# Patient Record
Sex: Male | Born: 1965 | Hispanic: No | Marital: Married | Smoking: Former smoker
Health system: Southern US, Community
[De-identification: ages and names within clinical notes are randomized; demographics above are authoritative.]

## PROBLEM LIST (undated history)

## (undated) DIAGNOSIS — K219 Gastro-esophageal reflux disease without esophagitis: Secondary | ICD-10-CM

## (undated) DIAGNOSIS — S83249A Other tear of medial meniscus, current injury, unspecified knee, initial encounter: Secondary | ICD-10-CM

## (undated) HISTORY — PX: NO PAST SURGERIES: SHX2092

---

## 1998-09-25 ENCOUNTER — Emergency Department (HOSPITAL_COMMUNITY): Admission: EM | Admit: 1998-09-25 | Discharge: 1998-09-25 | Payer: Self-pay | Admitting: Emergency Medicine

## 2000-02-11 ENCOUNTER — Emergency Department (HOSPITAL_COMMUNITY): Admission: EM | Admit: 2000-02-11 | Discharge: 2000-02-11 | Payer: Self-pay | Admitting: Emergency Medicine

## 2000-02-12 ENCOUNTER — Encounter: Payer: Self-pay | Admitting: Emergency Medicine

## 2012-02-03 ENCOUNTER — Ambulatory Visit
Admission: RE | Admit: 2012-02-03 | Discharge: 2012-02-03 | Disposition: A | Discharge status: Home / Self Care | Payer: No Typology Code available for payment source | Source: Ambulatory Visit | Attending: Physician Assistant | Admitting: Physician Assistant

## 2012-02-03 ENCOUNTER — Other Ambulatory Visit: Payer: Self-pay | Admitting: Physician Assistant

## 2012-02-03 DIAGNOSIS — M79672 Pain in left foot: Secondary | ICD-10-CM

## 2018-05-06 ENCOUNTER — Other Ambulatory Visit: Payer: Self-pay

## 2018-05-06 ENCOUNTER — Encounter: Payer: Self-pay | Admitting: Emergency Medicine

## 2018-05-06 ENCOUNTER — Ambulatory Visit: Payer: BLUE CROSS/BLUE SHIELD | Admitting: Emergency Medicine

## 2018-05-06 VITALS — BP 127/76 | HR 68 | Temp 98.3°F | Resp 16 | Ht 66.0 in | Wt 160.2 lb

## 2018-05-06 DIAGNOSIS — Z1211 Encounter for screening for malignant neoplasm of colon: Secondary | ICD-10-CM | POA: Diagnosis not present

## 2018-05-06 DIAGNOSIS — Z0000 Encounter for general adult medical examination without abnormal findings: Secondary | ICD-10-CM | POA: Diagnosis not present

## 2018-05-06 DIAGNOSIS — Z23 Encounter for immunization: Secondary | ICD-10-CM

## 2018-05-06 DIAGNOSIS — Z114 Encounter for screening for human immunodeficiency virus [HIV]: Secondary | ICD-10-CM

## 2018-05-06 DIAGNOSIS — Z13228 Encounter for screening for other metabolic disorders: Secondary | ICD-10-CM

## 2018-05-06 DIAGNOSIS — Z130 Encounter for screening for diseases of the blood and blood-forming organs and certain disorders involving the immune mechanism: Secondary | ICD-10-CM | POA: Diagnosis not present

## 2018-05-06 DIAGNOSIS — Z13220 Encounter for screening for lipoid disorders: Secondary | ICD-10-CM

## 2018-05-06 DIAGNOSIS — Z1329 Encounter for screening for other suspected endocrine disorder: Secondary | ICD-10-CM

## 2018-05-06 NOTE — Progress Notes (Signed)
Vincent Wade 52 y.o.   Chief Complaint  Patient presents with  . Annual Exam    HISTORY OF PRESENT ILLNESS: This is a 52 y.o. male here for annual exam.  First time visit with me. Denies any chronic medical problems on no chronic medications. Non-smoker.  Non-EtOH abuser. Works as a Education administrator.  Adequate sleep.  Good nutrition. Never had a colonoscopy. Needs Tdap and flu vaccine. Today has no complaints or medical concerns.   HPI   Prior to Admission medications   Not on File    No Known Allergies  There are no active problems to display for this patient.   No past medical history on file.    Social History   Socioeconomic History  . Marital status: Married    Spouse name: Not on file  . Number of children: Not on file  . Years of education: Not on file  . Highest education level: Not on file  Occupational History  . Not on file  Social Needs  . Financial resource strain: Not on file  . Food insecurity:    Worry: Not on file    Inability: Not on file  . Transportation needs:    Medical: Not on file    Non-medical: Not on file  Tobacco Use  . Smoking status: Never Smoker  . Smokeless tobacco: Never Used  Substance and Sexual Activity  . Alcohol use: Never    Frequency: Never  . Drug use: Never  . Sexual activity: Not on file  Lifestyle  . Physical activity:    Days per week: Not on file    Minutes per session: Not on file  . Stress: Not on file  Relationships  . Social connections:    Talks on phone: Not on file    Gets together: Not on file    Attends religious service: Not on file    Active member of club or organization: Not on file    Attends meetings of clubs or organizations: Not on file    Relationship status: Not on file  . Intimate partner violence:    Fear of current or ex partner: Not on file    Emotionally abused: Not on file    Physically abused: Not on file    Forced sexual activity: Not on file  Other Topics Concern  . Not on  file  Social History Narrative  . Not on file    No family history on file.   Review of Systems  Constitutional: Negative.  Negative for chills, fever and weight loss.  HENT: Negative.  Negative for hearing loss, sinus pain and sore throat.   Eyes: Negative.  Negative for blurred vision, double vision, discharge and redness.  Respiratory: Negative.  Negative for cough and shortness of breath.   Cardiovascular: Negative.  Negative for chest pain and palpitations.  Gastrointestinal: Negative.  Negative for abdominal pain, blood in stool, diarrhea, melena, nausea and vomiting.  Genitourinary: Negative.  Negative for dysuria, flank pain and hematuria.  Musculoskeletal: Positive for back pain (Occasional right-sided lumbar pain).  Skin: Negative.  Negative for rash.  Neurological: Negative.  Negative for dizziness and headaches.  Endo/Heme/Allergies: Negative.   All other systems reviewed and are negative.   Vitals:   05/06/18 0811  BP: 127/76  Pulse: 68  Resp: 16  Temp: 98.3 F (36.8 C)  SpO2: 97%    Physical Exam  Constitutional: He is oriented to person, place, and time. He appears well-developed and well-nourished.  HENT:  Head: Normocephalic and atraumatic.  Right Ear: External ear normal.  Left Ear: External ear normal.  Nose: Nose normal.  Mouth/Throat: Oropharynx is clear and moist.  Eyes: Pupils are equal, round, and reactive to light. Conjunctivae and EOM are normal.  Neck: Normal range of motion. Neck supple. Carotid bruit is not present. No thyromegaly present.  Cardiovascular: Normal rate, regular rhythm, normal heart sounds and intact distal pulses.  Pulmonary/Chest: Effort normal and breath sounds normal.  Abdominal: Soft. Bowel sounds are normal. He exhibits no distension. There is no tenderness.  Musculoskeletal: Normal range of motion.  Lymphadenopathy:    He has no cervical adenopathy.  Neurological: He is alert and oriented to person, place, and time.  No sensory deficit. He exhibits normal muscle tone.  Skin: Skin is warm and dry. Capillary refill takes less than 2 seconds.  Psychiatric: He has a normal mood and affect. His behavior is normal.  Vitals reviewed.    ASSESSMENT & PLAN: Vincent Wade was seen today for annual exam.  Diagnoses and all orders for this visit:  Screening for HIV (human immunodeficiency virus) -     HIV Antibody (routine testing w rflx)  Routine general medical examination at a health care facility -     Comprehensive metabolic panel -     CBC with Differential/Platelet  Colon cancer screening -     Ambulatory referral to Gastroenterology  Screening for deficiency anemia -     CBC with Differential/Platelet  Screening for lipoid disorders -     Lipid panel  Screening for endocrine, metabolic and immunity disorder -     Comprehensive metabolic panel -     CBC with Differential/Platelet -     Lipid panel -     TSH -     Hemoglobin A1c  Vaccine for diphtheria-tetanus-pertussis, combined -     Tdap vaccine greater than or equal to 7yo IM  Need for immunization against influenza  Flu vaccine need -     Flu Vaccine QUAD 36+ mos IM    Patient Instructions       If you have lab work done today you will be contacted with your lab results within the next 2 weeks.  If you have not heard from [place] then please contact us. The fastest way to get your results is to register for My Chart.   IF you received an x-ray today, you will receive an invoice from [facility]. Please contact [facility] at [phone] with questions or concerns regarding your invoice.   IF you received labwork today, you will receive an invoice from [facility]. Please contact [facility] at [phone] with questions or concerns regarding your invoice.   Our billing staff will not be able to assist you with questions regarding bills from these companies.  You will be contacted with the lab results as soon as they are  available. The fastest way to get your results is to activate your My Chart account. Instructions are located on the last page of this paperwork. If you have not heard from [place] regarding the results in 2 weeks, please contact this office.      Mantenimiento de Engineer, civil (consulting) hombres (Health Maintenance, Male) Un estilo de vida saludable y los cuidados preventivos son importantes para la salud y Counsellor. Pregntele al mdico cul es el cronograma de exmenes peridicos adecuado para usted. QU DEBO SABER SOBRE EL PESO Y LA DIETA? Consuma una dieta saludable  Coma muchas verduras, frutas, cereales integrales,  productos lcteos con bajo contenido de [place]grasa y protenas magras.  No consuma muchos alimentos de alto contenido de grasas slidas, azcares agregados o sal. Mantenga un peso saludable La actividad fsica habitual puede ayudarlo a Baristaalcanzar o mantener un peso saludable. Deber hacer lo siguiente:  Realizar al menos 150minutos de actividad fsica por semana. El ejercicio debe aumentar la frecuencia cardaca y Development worker, international aidprovocar la transpiracin (ejercicio de Kanarravilleintensidad moderada).  Hacer ejercicios de entrenamiento de fuerza por lo Rite Aidmenos dos veces por semana. Controlarse los niveles de colesterol y lpidos en la sangre  Hgase anlisis de sangre para controlar los lpidos y el colesterol cada 5aos a partir de los 35aos. Si tiene un riesgo alto de Warehouse managertener cardiopatas coronarias, debe comenzar a Assuranthacerse anlisis de [place]sangre a los [place]20aos. Es posible que Insurance underwriternecesite controlar los niveles de colesterol con mayor frecuencia si: ? Sus niveles de lpidos y colesterol son altos. ? Es mayor de [id number]. ? Tiene un riesgo alto de tener cardiopatas coronarias. QU DEBO SABER SOBRE LAS PRUEBAS DE DETECCIN DEL CNCER? Muchos tipos de cncer se pueden detectar de manera temprana y a menudo prevenirse. Cncer de pulmn  Debe someterse a pruebas de deteccin de cncer de pulmn todos los aos en los  siguientes casos: ? Si fuma actualmente y lo ha hecho durante por lo menos 30aos. ? Si fue fumador que dej el hbito en el trmino de los ltimos 15aos.  Hable con el mdico sobre las opciones en relacin con los estudios de deteccin, cundo debe comenzar a Actuaryhacrselos y con Engineer, structuralqu frecuencia. Cncer colorrectal  Generalmente, las pruebas de deteccin habituales del cncer colorrectal comienzan a los 50aos y deben repetirse cada 5 a 10aos hasta los 75aos. Es posible que tenga que hacerse las pruebas con mayor frecuencia si se detectan formas tempranas de plipos precancerosos o pequeos bultos. Sin embargo, el mdico podr aconsejarle que lo haga antes, si tiene factores de riesgo para el cncer de colon.  El mdico puede recomendarle que use kits de prueba caseros para Recruitment consultanthallar sangre oculta en la materia fecal.  Se puede usar una pequea cmara en el extremo de un tubo para examinar el colon (sigmoidoscopia o colonoscopia). Este estudio PPG Industriesdetecta las formas ms tempranas de Building services engineercncer colorrectal. Cncer de prstata y de testculo  En funcin de la edad y del [place] Williamestado de salud general, el mdico puede realizarle determinados estudios de deteccin del cncer de prstata y de testculo.  Hable con el mdico sobre cualquier sntoma o acerca de las inquietudes que tenga sobre el cncer de prstata o de testculo. Cncer de piel  Revise la piel de la cabeza a los pies con regularidad.  Informe al mdico si aparecen nuevos lunares o si nota cambios en los que ya tiene, especialmente en estos casos: ? Si hay un cambio en el tamao, la forma o el color del lunar. ? Si tiene un lunar que es ms grande que el tamao de una goma de Paramediclpiz.  Siempre use pantalla solar. Aplquese pantalla solar de Barth Kirksmanera generosa y repetida a lo largo del Futures traderda.  Use mangas y Automatic Datapantalones largos, un sombrero de ala ancha y gafas para el sol cuando est al Guadalupe Dawnaire libre, para protegerse. QU DEBO SABER SOBRE LAS CARDIOPATAS  CORONARIAS, LA DIABETES Y LA HIPERTENSIN ARTERIAL?  Si usted tiene entre 18 y 39aos, debe medirse la presin arterial cada 3a 5aos. Si usted tiene 40aos o ms, debe medirse la presin arterial Allied Waste Industriestodos los aos. Debe medirse la presin arterial dos veces:  una vez cuando est en un hospital o una clnica y la otra vez cuando est en otro sitio. Registre el promedio de [employer]. Para controlar su presin arterial cuando no est en un hospital o Paulita Cradle, puede usar lo siguiente: ? Valere Dross automtica para medir la presin arterial en una farmacia. ? Un monitor para medir la presin arterial en el hogar.  Hable con el mdico [employer] ideales de la presin arterial.  Si tiene entre 45 y 79aos, consltele al mdico si debe tomar aspirina para evitar las cardiopatas coronarias.  Hgase anlisis habituales de deteccin de la diabetes; para ello, contrlese la glucemia en ayunas. ? Si su peso es normal y tiene un bajo riesgo de padecer diabetes, realcese este anlisis cada tres aos despus de los 45aos. ? Si tiene sobrepeso y un alto riesgo de padecer diabetes, considere someterse a este anlisis antes o con mayor frecuencia.  Para los hombres que tienen entre 65 y 84aos, y son o han sido fumadores, se recomienda un nico estudio con ecografa para Engineer, manufacturing un aneurisma de aorta abdominal (AAA). QU DEBO SABER SOBRE LA PREVENCIN DE LAS INFECCIONES? HepatitisB Si tiene un riesgo ms alto de Primary school teacher hepatitis B, debe someterse a un examen de deteccin de este virus. Hable con el mdico para determinar si corre riesgo de tener una infeccin por hepatitisB. Hepatitis C Se recomienda un anlisis de [place] para:  Todos los que nacieron entre 1945 y [phone].  Todas las personas que tengan un riesgo de haber contrado hepatitis C. Enfermedades de transmisin sexual (ETS)  Debe realizarse pruebas de Airline pilot de las ETS todos los aos, incluidas la gonorrea y la  clamidia, en estos casos: ? Es sexualmente activo y es menor de [place]. ? Es mayor de 24aos, y Public affairs consultant informa que corre riesgo de tener este tipo de infecciones. ? La actividad sexual ha cambiado desde que le hicieron la ltima prueba de deteccin y tiene un riesgo mayor de Warehouse manager clamidia o Copy. Pregntele al mdico si usted tiene riesgo.  Consulte a su mdico para saber si tiene un alto riesgo de infectarse por el VIH. El mdico puede recomendarle un medicamento de venta con receta para ayudar a evitar la infeccin por el VIH. QU MS PUEDO HACER?  Realcese los estudios de rutina de la salud, dentales y de Wellsite geologist.  Mantngase al da con las vacunas (inmunizaciones).  No consuma ningn producto que contenga tabaco, lo que incluye cigarrillos, tabaco de Theatre manager y Administrator, Civil Service. Si necesita ayuda para dejar de fumar, consulte al mdico.  Limite el consumo de alcohol a no ms de por da. [employer] a 12 onzas de cerveza, 5onzas de vino o 1onzas de bebidas alcohlicas de alta graduacin.  No consuma drogas.  No comparta agujas.  Solicite ayuda a su mdico si necesita apoyo o informacin para abandonar las drogas.  Informe a su mdico si a menudo se siente deprimido.  Notifique a su mdico si alguna vez ha sido vctima de abuso o si no se siente seguro en su hogar. Esta informacin no tiene Theme park manager el consejo del mdico. Asegrese de hacerle al mdico cualquier pregunta que tenga. Document Released: 11/14/2007 Document Revised: 06/08/2014 Document Reviewed: 02/19/2015 Elsevier Interactive Patient Education  2018 Elsevier Inc.      Edwina Barth, MD [facility]

## 2018-05-06 NOTE — Patient Instructions (Addendum)
   If you have lab work done today you will be contacted with your lab results within the next 2 weeks.  If you have not heard from us then please contact us. The fastest way to get your results is to register for My Chart.   IF you received an x-ray today, you will receive an invoice from [facility]. Please contact [facility] at [phone] with questions or concerns regarding your invoice.   IF you received labwork today, you will receive an invoice from [facility]. Please contact [facility] at [phone] with questions or concerns regarding your invoice.   Our billing staff will not be able to assist you with questions regarding bills from these companies.  You will be contacted with the lab results as soon as they are available. The fastest way to get your results is to activate your My Chart account. Instructions are located on the last page of this paperwork. If you have not heard from us regarding the results in 2 weeks, please contact this office.     Mantenimiento de la salud en los hombres (Health Maintenance, Male) Un estilo de vida saludable y los cuidados preventivos son importantes para la salud y el bienestar. Pregntele al mdico cul es el cronograma de exmenes peridicos adecuado para usted. QU DEBO SABER SOBRE EL PESO Y LA DIETA? Consuma una dieta saludable  Coma muchas verduras, frutas, cereales integrales, productos lcteos con bajo contenido de grasa y protenas magras.  No consuma muchos alimentos de alto contenido de grasas slidas, azcares agregados o sal. Mantenga un peso saludable La actividad fsica habitual puede ayudarlo a alcanzar o mantener un peso saludable. Deber hacer lo siguiente:  Realizar al menos 150minutos de actividad fsica por semana. El ejercicio debe aumentar la frecuencia cardaca y provocar la transpiracin (ejercicio de intensidad moderada).  Hacer ejercicios de entrenamiento de fuerza por lo menos dos veces por  semana. Controlarse los niveles de colesterol y lpidos en la sangre  Hgase anlisis de sangre para controlar los lpidos y el colesterol cada 5aos a partir de los 35aos. Si tiene un riesgo alto de tener cardiopatas coronarias, debe comenzar a hacerse anlisis de sangre a los 20aos. Es posible que necesite controlar los niveles de colesterol con mayor frecuencia si: ? Sus niveles de lpidos y colesterol son altos. ? Es mayor de 50aos. ? Tiene un riesgo alto de tener cardiopatas coronarias. QU DEBO SABER SOBRE LAS PRUEBAS DE DETECCIN DEL CNCER? Muchos tipos de cncer se pueden detectar de manera temprana y a menudo prevenirse. Cncer de pulmn  Debe someterse a pruebas de deteccin de cncer de pulmn todos los aos en los siguientes casos: ? Si fuma actualmente y lo ha hecho durante por lo menos 30aos. ? Si fue fumador que dej el hbito en el trmino de los ltimos 15aos.  Hable con el mdico sobre las opciones en relacin con los estudios de deteccin, cundo debe comenzar a hacrselos y con qu frecuencia. Cncer colorrectal  Generalmente, las pruebas de deteccin habituales del cncer colorrectal comienzan a los 50aos y deben repetirse cada 5 a 10aos hasta los 75aos. Es posible que tenga que hacerse las pruebas con mayor frecuencia si se detectan formas tempranas de plipos precancerosos o pequeos bultos. Sin embargo, el mdico podr aconsejarle que lo haga antes, si tiene factores de riesgo para el cncer de colon.  El mdico puede recomendarle que use kits de prueba caseros para hallar sangre oculta en la materia fecal.  Se puede usar   una pequea cmara en el extremo de un tubo para examinar el colon (sigmoidoscopia o colonoscopia). Este estudio detecta las formas ms tempranas de cncer colorrectal. Cncer de prstata y de testculo  En funcin de la edad y del estado de salud general, el mdico puede realizarle determinados estudios de deteccin del cncer  de prstata y de testculo.  Hable con el mdico sobre cualquier sntoma o acerca de las inquietudes que tenga sobre el cncer de prstata o de testculo. Cncer de piel  Revise la piel de la cabeza a los pies con regularidad.  Informe al mdico si aparecen nuevos lunares o si nota cambios en los que ya tiene, especialmente en estos casos: ? Si hay un cambio en el tamao, la forma o el color del lunar. ? Si tiene un lunar que es ms grande que el tamao de una goma de lpiz.  Siempre use pantalla solar. Aplquese pantalla solar de manera generosa y repetida a lo largo del da.  Use mangas y pantalones largos, un sombrero de ala ancha y gafas para el sol cuando est al aire libre, para protegerse. QU DEBO SABER SOBRE LAS CARDIOPATAS CORONARIAS, LA DIABETES Y LA HIPERTENSIN ARTERIAL?  Si usted tiene entre 18 y 39aos, debe medirse la presin arterial cada 3a 5aos. Si usted tiene 40aos o ms, debe medirse la presin arterial todos los aos. Debe medirse la presin arterial dos veces: una vez cuando est en un hospital o una clnica y la otra vez cuando est en otro sitio. Registre el promedio de las dos mediciones. Para controlar su presin arterial cuando no est en un hospital o una clnica, puede usar lo siguiente: ? Una mquina automtica para medir la presin arterial en una farmacia. ? Un monitor para medir la presin arterial en el hogar.  Hable con el mdico sobre los valores ideales de la presin arterial.  Si tiene entre 45 y 79aos, consltele al mdico si debe tomar aspirina para evitar las cardiopatas coronarias.  Hgase anlisis habituales de deteccin de la diabetes; para ello, contrlese la glucemia en ayunas. ? Si su peso es normal y tiene un bajo riesgo de padecer diabetes, realcese este anlisis cada tres aos despus de los 45aos. ? Si tiene sobrepeso y un alto riesgo de padecer diabetes, considere someterse a este anlisis antes o con mayor  frecuencia.  Para los hombres que tienen entre 65 y 75aos, y son o han sido fumadores, se recomienda un nico estudio con ecografa para detectar un aneurisma de aorta abdominal (AAA). QU DEBO SABER SOBRE LA PREVENCIN DE LAS INFECCIONES? HepatitisB Si tiene un riesgo ms alto de contraer hepatitis B, debe someterse a un examen de deteccin de este virus. Hable con el mdico para determinar si corre riesgo de tener una infeccin por hepatitisB. Hepatitis C Se recomienda un anlisis de sangre para:  Todos los que nacieron entre 1945 y 1965.  Todas las personas que tengan un riesgo de haber contrado hepatitis C. Enfermedades de transmisin sexual (ETS)  Debe realizarse pruebas de deteccin de las ETS todos los aos, incluidas la gonorrea y la clamidia, en estos casos: ? Es sexualmente activo y es menor de 24aos. ? Es mayor de 24aos, y el mdico le informa que corre riesgo de tener este tipo de infecciones. ? La actividad sexual ha cambiado desde que le hicieron la ltima prueba de deteccin y tiene un riesgo mayor de tener clamidia o gonorrea. Pregntele al mdico si usted tiene riesgo.  Consulte a su   su mdico para saber si tiene un alto riesgo de infectarse por el VIH. El mdico puede recomendarle un medicamento de venta con receta para ayudar a evitar la infeccin por el VIH. QU MS PUEDO HACER?  Realcese los estudios de rutina de la salud, dentales y de Wellsite geologistla vista.  Mantngase al da con las vacunas (inmunizaciones).  No consuma ningn producto que contenga tabaco, lo que incluye cigarrillos, tabaco de Theatre managermascar y Administrator, Civil Servicecigarrillos electrnicos. Si necesita ayuda para dejar de fumar, consulte al mdico.  Limite el consumo de alcohol a no ms de 2medidas por da. BorgWarnerUna medida equivale a 12 onzas de cerveza, 5onzas de vino o 1onzas de bebidas alcohlicas de alta graduacin.  No consuma drogas.  No comparta agujas.  Solicite ayuda a su mdico si necesita apoyo o informacin para  abandonar las drogas.  Informe a su mdico si a menudo se siente deprimido.  Notifique a su mdico si alguna vez ha sido vctima de abuso o si no se siente seguro en su hogar. Esta informacin no tiene Theme park managercomo fin reemplazar el consejo del mdico. Asegrese de hacerle al mdico cualquier pregunta que tenga. Document Released: 11/14/2007 Document Revised: 06/08/2014 Document Reviewed: 02/19/2015 Elsevier Interactive Patient Education  [employer]2018 Elsevier Inc.

## 2018-05-07 LAB — COMPREHENSIVE METABOLIC PANEL
ALT: 20 IU/L (ref 0–44)
AST: 18 IU/L (ref 0–40)
Albumin/Globulin Ratio: 1.7 (ref 1.2–2.2)
Albumin: 4.7 g/dL (ref 3.5–5.5)
Alkaline Phosphatase: 74 IU/L (ref 39–117)
BUN/Creatinine Ratio: 13 (ref 9–20)
BUN: 12 mg/dL (ref 6–24)
Bilirubin Total: 0.6 mg/dL (ref 0.0–1.2)
CO2: 25 mmol/L (ref 20–29)
Calcium: 10 mg/dL (ref 8.7–10.2)
Chloride: 100 mmol/L (ref 96–106)
Creatinine, Ser: 0.96 mg/dL (ref 0.76–1.27)
GFR calc Af Amer: 104 mL/min/{1.73_m2} (ref >59)
GFR calc non Af Amer: 90 mL/min/{1.73_m2} (ref >59)
Globulin, Total: 2.7 g/dL (ref 1.5–4.5)
Glucose: 104 mg/dL — ABNORMAL HIGH (ref 65–99)
Potassium: 4.6 mmol/L (ref 3.5–5.2)
Sodium: 139 mmol/L (ref 134–144)
Total Protein: 7.4 g/dL (ref 6.0–8.5)

## 2018-05-07 LAB — HIV ANTIBODY (ROUTINE TESTING W REFLEX): HIV Screen 4th Generation wRfx: NONREACTIVE

## 2018-05-07 LAB — HEMOGLOBIN A1C
Est. average glucose Bld gHb Est-mCnc: 114 mg/dL
Hgb A1c MFr Bld: 5.6 % (ref 4.8–5.6)

## 2018-05-07 LAB — CBC WITH DIFFERENTIAL/PLATELET
Basophils Absolute: 0.1 10*3/uL (ref 0.0–0.2)
Basos: 1 %
EOS (ABSOLUTE): 0.1 10*3/uL (ref 0.0–0.4)
Eos: 2 %
Hematocrit: 49.7 % (ref 37.5–51.0)
Hemoglobin: 16.8 g/dL (ref 13.0–17.7)
Immature Grans (Abs): 0 10*3/uL (ref 0.0–0.1)
Immature Granulocytes: 0 %
Lymphocytes Absolute: 1.8 10*3/uL (ref 0.7–3.1)
Lymphs: 33 %
MCH: 28.8 pg (ref 26.6–33.0)
MCHC: 33.8 g/dL (ref 31.5–35.7)
MCV: 85 fL (ref 79–97)
Monocytes Absolute: 0.4 10*3/uL (ref 0.1–0.9)
Monocytes: 8 %
Neutrophils Absolute: 3.2 10*3/uL (ref 1.4–7.0)
Neutrophils: 56 %
Platelets: 236 10*3/uL (ref 150–450)
RBC: 5.83 x10E6/uL — ABNORMAL HIGH (ref 4.14–5.80)
RDW: 12.9 % (ref 12.3–15.4)
WBC: 5.6 10*3/uL (ref 3.4–10.8)

## 2018-05-07 LAB — LIPID PANEL
Chol/HDL Ratio: 3.7 ratio (ref 0.0–5.0)
Cholesterol, Total: 214 mg/dL — ABNORMAL HIGH (ref 100–199)
HDL: 58 mg/dL (ref >39)
LDL Calculated: 132 mg/dL — ABNORMAL HIGH (ref 0–99)
Triglycerides: 121 mg/dL (ref 0–149)
VLDL Cholesterol Cal: 24 mg/dL (ref 5–40)

## 2018-05-07 LAB — TSH: TSH: 2.2 u[IU]/mL (ref 0.450–4.500)

## 2018-05-10 ENCOUNTER — Encounter: Payer: Self-pay | Admitting: *Deleted

## 2018-05-11 ENCOUNTER — Encounter: Payer: Self-pay | Admitting: Gastroenterology

## 2018-07-01 ENCOUNTER — Encounter: Payer: Self-pay | Admitting: Gastroenterology

## 2019-04-03 ENCOUNTER — Other Ambulatory Visit: Payer: Self-pay

## 2019-04-03 ENCOUNTER — Other Ambulatory Visit: Payer: Self-pay | Admitting: *Deleted

## 2019-04-03 DIAGNOSIS — Z125 Encounter for screening for malignant neoplasm of prostate: Secondary | ICD-10-CM

## 2019-04-03 NOTE — Progress Notes (Signed)
Patient: Vincent Wade           Date of Birth: 1965/09/04           MRN: [id number] Visit Date: 04/03/2019 PCP: Horald Pollen, MD Temp: 96.9 Temporal    Prostate Exam Exam not completed.  Patient's History There are no active problems to display for this patient.  No past medical history on file.  No family history on file.  Social History   Occupational History  . Not on file  Tobacco Use  . Smoking status: Never Smoker  . Smokeless tobacco: Never Used  Substance and Sexual Activity  . Alcohol use: Never    Frequency: Never  . Drug use: Never  . Sexual activity: Not on file

## 2019-04-04 LAB — PSA: Prostate Specific Ag, Serum: 1.1 ng/mL (ref 0.0–4.0)

## 2020-01-26 ENCOUNTER — Encounter (HOSPITAL_COMMUNITY): Payer: Self-pay | Admitting: Emergency Medicine

## 2020-01-26 ENCOUNTER — Emergency Department (HOSPITAL_COMMUNITY)
Admission: EM | Admit: 2020-01-26 | Discharge: 2020-01-27 | Disposition: A | Discharge status: Home / Self Care | Payer: Self-pay | Attending: Emergency Medicine | Admitting: Emergency Medicine

## 2020-01-26 DIAGNOSIS — R2689 Other abnormalities of gait and mobility: Secondary | ICD-10-CM | POA: Insufficient documentation

## 2020-01-26 DIAGNOSIS — M79662 Pain in left lower leg: Secondary | ICD-10-CM | POA: Insufficient documentation

## 2020-01-26 DIAGNOSIS — M79604 Pain in right leg: Secondary | ICD-10-CM

## 2020-01-26 DIAGNOSIS — M7918 Myalgia, other site: Secondary | ICD-10-CM | POA: Insufficient documentation

## 2020-01-26 NOTE — ED Triage Notes (Signed)
Pt c/o RLE pain x 2 weeks. Pt has not been seen for pain, pt sent here from MD office after noticing the leg slightly more swollen than the the left for doppler

## 2020-01-27 ENCOUNTER — Emergency Department (HOSPITAL_BASED_OUTPATIENT_CLINIC_OR_DEPARTMENT_OTHER): Payer: Self-pay

## 2020-01-27 ENCOUNTER — Other Ambulatory Visit: Payer: Self-pay

## 2020-01-27 DIAGNOSIS — M79609 Pain in unspecified limb: Secondary | ICD-10-CM

## 2020-01-27 DIAGNOSIS — M7989 Other specified soft tissue disorders: Secondary | ICD-10-CM

## 2020-01-27 MED ORDER — IBUPROFEN 800 MG PO TABS
800.0000 mg | ORAL_TABLET | Freq: Once | ORAL | Status: DC
  Filled 2020-01-27: qty 1

## 2020-01-27 MED ORDER — METHYLPREDNISOLONE 4 MG PO TBPK: ORAL_TABLET | ORAL | 0 refills | Status: DC

## 2020-01-27 MED ORDER — CELECOXIB 200 MG PO CAPS: 200.0000 mg | ORAL_CAPSULE | Freq: Two times a day (BID) | ORAL | 0 refills | Status: DC

## 2020-01-27 NOTE — Progress Notes (Signed)
VASCULAR LAB    Right lower extremity venous duplex completed.    Preliminary report:  See CV proc for preliminary results.  Messaged results to Arthor Captain, PA-C  Cinthia Rodden, RVT 01/27/2020, 12:15 PM

## 2020-01-27 NOTE — ED Provider Notes (Signed)
[facility] EMERGENCY DEPARTMENT Provider Note   CSN: [id number] Arrival date & time: 01/26/20  1958     History Chief Complaint  Patient presents with  . Leg Pain    Vincent Wade is a 54 y.o. male who presents for evaluation of RLE pain. History is limited by language barrier. Patient's family member translates. Pain began 2 weeks ago. In the R calf. He is painfree at rest. Pain is worse with ambulation and flexion of the R knee. No known injuries. No weakness, numbness or tingling. He was seen at PCP last night and sent in for [place] to rule out DVT. He denies cp or sob  HPI     History reviewed. No pertinent past medical history.  There are no problems to display for this patient.   History reviewed. No pertinent surgical history.     No family history on file.  Social History   Tobacco Use  . Smoking status: Never Smoker  . Smokeless tobacco: Never Used  Substance Use Topics  . Alcohol use: Never  . Drug use: Never    Home Medications Prior to Admission medications   Not on File    Allergies    Patient has no known allergies.  Review of Systems   Review of Systems  Constitutional: Negative for chills and fever.  Musculoskeletal: Positive for gait problem and myalgias. Negative for arthralgias and joint swelling.  Skin: Negative for color change and wound.  Neurological: Negative for weakness and numbness.    Physical Exam Updated Vital Signs BP 112/66 (BP Location: Left Arm)   Pulse 60   Temp 98.2 F (36.8 C) (Oral)   Resp 12   SpO2 100%   Physical Exam Vitals and nursing note reviewed.  Constitutional:      General: He is not in acute distress.    Appearance: He is well-developed. He is not diaphoretic.  HENT:     Head: Normocephalic and atraumatic.  Eyes:     General: No scleral icterus.    Conjunctiva/sclera: Conjunctivae normal.  Cardiovascular:     Rate and Rhythm: Normal rate and regular rhythm.     Heart sounds:  Normal heart sounds.  Pulmonary:     Effort: Pulmonary effort is normal. No respiratory distress.     Breath sounds: Normal breath sounds.  Abdominal:     Palpations: Abdomen is soft.     Tenderness: There is no abdominal tenderness.  Musculoskeletal:        General: No tenderness.     Cervical back: Normal range of motion and neck supple.     Right lower leg: No edema.     Left lower leg: No edema.     Comments: Questionable swelling R>L No tenderness to palpation Normal R knee and ankle exam No achilles or popliteal tenderness.  DP/PT 2+ Normal sensation Negative straight leg test  Skin:    General: Skin is warm and dry.  Neurological:     Mental Status: He is alert.  Psychiatric:        Behavior: Behavior normal.     ED Results / Procedures / Treatments   Labs (all labs ordered are listed, but only abnormal results are displayed) Labs Reviewed - No data to display  EKG None  Radiology No results found.  Procedures Procedures (including critical care time)  Medications Ordered in ED Medications - No data to display  ED Course  I have reviewed the triage vital signs and the nursing  notes.  Pertinent labs & imaging results that were available during my care of the patient were reviewed by me and considered in my medical decision making (see chart for details).    MDM Rules/Calculators/A&P                           Patient here with Left calf pain when ambulating. DVT study is negative. No evidence of compartment syndrome. The compartments are soft.  He potentially could have sciatica as the cause of the pain down the back of his calf and will treat with anti-inflammatory and Medrol Dosepak.  Patient will be discharged to follow-up with his primary care physician.  I doubt any other emergent cause of his leg pain.  Final Clinical Impression(s) / ED Diagnoses Final diagnoses:  None    Rx / DC Orders ED Discharge Orders    None       Arthor Captain,  PA-C 01/27/20 1218    Tilden Fossa, MD 01/27/20 1434

## 2020-01-27 NOTE — Discharge Instructions (Addendum)
Please follow up as soon as possible with your primary care doctor.  Contact a health care provider if: Your symptoms do not improve with rest and treatment. Get help right away if: You have swelling or redness in your calf that is getting worse. Your skin or toenails turn blue or gray, feel cold, or become numb.

## 2020-07-09 ENCOUNTER — Other Ambulatory Visit: Payer: Self-pay

## 2020-07-09 ENCOUNTER — Encounter: Payer: Self-pay | Admitting: Orthopaedic Surgery

## 2020-07-09 ENCOUNTER — Ambulatory Visit (INDEPENDENT_AMBULATORY_CARE_PROVIDER_SITE_OTHER): Payer: 59 | Admitting: Orthopaedic Surgery

## 2020-07-09 DIAGNOSIS — S83241A Other tear of medial meniscus, current injury, right knee, initial encounter: Secondary | ICD-10-CM | POA: Diagnosis not present

## 2020-07-09 NOTE — Progress Notes (Signed)
Office Visit Note   Patient: Vincent Wade           Date of Birth: April 25, 1966           MRN: [id number] Visit Date: 07/09/2020              Requested by: Georgina Quint, MD [address] [place],  [place] [address] PCP: Georgina Quint, MD   Assessment & Plan: Visit Diagnoses:  1. Acute medial meniscus tear of right knee, initial encounter     Plan: Impression is acute right medial meniscal tear.  I reviewed the MRI images which do show complex tear of the posterior horn the medial meniscus.  Given these findings and lack of improvement from conservative treatment I have recommended arthroscopic partial medial meniscectomy in the near future.  Patient has elected to proceed with surgery after consideration of risk benefits rehab recovery.  Today's encounter was made more complex due to the language barrier.  Follow-Up Instructions: Return for 10-day postop visit.   Orders:  No orders of the defined types were placed in this encounter.  No orders of the defined types were placed in this encounter.     Procedures: No procedures performed   Clinical Data: No additional findings.   Subjective: Chief Complaint  Patient presents with  . Right Knee - Pain    Vincent Wade is a 55 year old Hispanic gentleman who comes in for acute right medial meniscal tear referral from Dr. Turner Daniels at [facility].  Patient's insurance is not accepted at [facility] orthopedics but is compatible with our office.  Briefly he is a very pleasant 55 year old gentleman who developed knee pain suddenly a few days after moving furniture back in September 2021.  He underwent conservative treatment with cortisone injections as well as pills at [facility].  He subsequently underwent an MRI due to continued pain and failure of conservative treatment and was found to have a complex tear of the posterior horn of the medial meniscus with vertical horizontal components.  He continues to have pain on  the medial and posterior medial portions of the right knee.  He has pain and mechanical catching as well.   Review of Systems  Constitutional: Negative.   All other systems reviewed and are negative.    Objective: Vital Signs: There were no vitals taken for this visit.  Physical Exam Vitals and nursing note reviewed.  Constitutional:      Appearance: He is well-developed and well-nourished.  HENT:     Head: Normocephalic and atraumatic.  Eyes:     Pupils: Pupils are equal, round, and reactive to light.  Pulmonary:     Effort: Pulmonary effort is normal.  Abdominal:     Palpations: Abdomen is soft.  Musculoskeletal:        General: Normal range of motion.     Cervical back: Neck supple.  Skin:    General: Skin is warm.  Neurological:     Mental Status: He is alert and oriented to person, place, and time.  Psychiatric:        Mood and Affect: Mood and affect normal.        Behavior: Behavior normal.        Thought Content: Thought content normal.        Judgment: Judgment normal.     Ortho Exam Right knee shows trace effusion with exquisite medial joint tenderness.  Pain on the medial side with increased flexion and pain with McMurray testing.  Collaterals and  cruciates are stable. Specialty Comments:  No specialty comments available.  Imaging: No results found.   PMFS History: Patient Active Problem List   Diagnosis Date Noted  . Acute medial meniscus tear of right knee 07/09/2020   History reviewed. No pertinent past medical history.  History reviewed. No pertinent family history.  History reviewed. No pertinent surgical history. Social History   Occupational History  . Not on file  Tobacco Use  . Smoking status: Never Smoker  . Smokeless tobacco: Never Used  Substance and Sexual Activity  . Alcohol use: Never  . Drug use: Never  . Sexual activity: Not on file

## 2020-07-15 ENCOUNTER — Other Ambulatory Visit: Payer: Self-pay

## 2020-07-16 ENCOUNTER — Ambulatory Visit: Payer: Self-pay | Admitting: Orthopaedic Surgery

## 2020-07-19 ENCOUNTER — Encounter (HOSPITAL_BASED_OUTPATIENT_CLINIC_OR_DEPARTMENT_OTHER): Payer: Self-pay | Admitting: Orthopaedic Surgery

## 2020-07-19 ENCOUNTER — Other Ambulatory Visit: Payer: Self-pay

## 2020-07-20 ENCOUNTER — Inpatient Hospital Stay (HOSPITAL_COMMUNITY): Admission: RE | Admit: 2020-07-20 | Payer: 59 | Source: Ambulatory Visit

## 2020-07-22 ENCOUNTER — Other Ambulatory Visit (HOSPITAL_COMMUNITY)
Admission: RE | Admit: 2020-07-22 | Discharge: 2020-07-22 | Disposition: A | Discharge status: Home / Self Care | Payer: 59 | Source: Ambulatory Visit | Attending: Orthopaedic Surgery | Admitting: Orthopaedic Surgery

## 2020-07-22 DIAGNOSIS — Z01812 Encounter for preprocedural laboratory examination: Secondary | ICD-10-CM | POA: Insufficient documentation

## 2020-07-22 DIAGNOSIS — Z20822 Contact with and (suspected) exposure to covid-19: Secondary | ICD-10-CM | POA: Diagnosis not present

## 2020-07-23 LAB — SARS CORONAVIRUS 2 (TAT 6-24 HRS): SARS Coronavirus 2: NEGATIVE

## 2020-07-24 ENCOUNTER — Encounter: Payer: Self-pay | Admitting: Orthopaedic Surgery

## 2020-07-24 ENCOUNTER — Ambulatory Visit (HOSPITAL_BASED_OUTPATIENT_CLINIC_OR_DEPARTMENT_OTHER): Payer: 59 | Admitting: Certified Registered"

## 2020-07-24 ENCOUNTER — Ambulatory Visit (HOSPITAL_BASED_OUTPATIENT_CLINIC_OR_DEPARTMENT_OTHER)
Admission: RE | Admit: 2020-07-24 | Discharge: 2020-07-24 | Disposition: A | Discharge status: Home / Self Care | Payer: 59 | Attending: Orthopaedic Surgery | Admitting: Orthopaedic Surgery

## 2020-07-24 ENCOUNTER — Encounter (HOSPITAL_BASED_OUTPATIENT_CLINIC_OR_DEPARTMENT_OTHER): Payer: Self-pay | Admitting: Orthopaedic Surgery

## 2020-07-24 ENCOUNTER — Other Ambulatory Visit: Payer: Self-pay

## 2020-07-24 ENCOUNTER — Encounter (HOSPITAL_BASED_OUTPATIENT_CLINIC_OR_DEPARTMENT_OTHER)
Admission: RE | Disposition: A | Discharge status: Home / Self Care | Payer: Self-pay | Source: Home / Self Care | Attending: Orthopaedic Surgery

## 2020-07-24 DIAGNOSIS — X58XXXA Exposure to other specified factors, initial encounter: Secondary | ICD-10-CM | POA: Diagnosis not present

## 2020-07-24 DIAGNOSIS — S83241A Other tear of medial meniscus, current injury, right knee, initial encounter: Secondary | ICD-10-CM | POA: Diagnosis not present

## 2020-07-24 DIAGNOSIS — Y939 Activity, unspecified: Secondary | ICD-10-CM | POA: Diagnosis not present

## 2020-07-24 DIAGNOSIS — M659 Synovitis and tenosynovitis, unspecified: Secondary | ICD-10-CM | POA: Insufficient documentation

## 2020-07-24 DIAGNOSIS — Z791 Long term (current) use of non-steroidal anti-inflammatories (NSAID): Secondary | ICD-10-CM | POA: Insufficient documentation

## 2020-07-24 HISTORY — DX: Other tear of medial meniscus, current injury, unspecified knee, initial encounter: S83.249A

## 2020-07-24 HISTORY — DX: Gastro-esophageal reflux disease without esophagitis: K21.9

## 2020-07-24 HISTORY — PX: KNEE ARTHROSCOPY WITH MENISCAL REPAIR: SHX5653

## 2020-07-24 SURGERY — ARTHROSCOPY, KNEE, WITH MENISCUS REPAIR
Anesthesia: General | Site: Knee | Laterality: Right

## 2020-07-24 MED ORDER — PROPOFOL 10 MG/ML IV BOLUS
INTRAVENOUS | Status: AC
  Filled 2020-07-24: qty 20

## 2020-07-24 MED ORDER — PROPOFOL 10 MG/ML IV BOLUS
INTRAVENOUS | Status: DC | PRN
  Administered 2020-07-24: 160 mg via INTRAVENOUS

## 2020-07-24 MED ORDER — BUPIVACAINE HCL (PF) 0.5 % IJ SOLN
INTRAMUSCULAR | Status: DC | PRN
  Administered 2020-07-24: 20 mL via INTRA_ARTICULAR

## 2020-07-24 MED ORDER — FENTANYL CITRATE (PF) 100 MCG/2ML IJ SOLN
INTRAMUSCULAR | Status: AC
  Filled 2020-07-24: qty 2

## 2020-07-24 MED ORDER — DEXAMETHASONE SODIUM PHOSPHATE 10 MG/ML IJ SOLN
INTRAMUSCULAR | Status: AC
  Filled 2020-07-24: qty 1

## 2020-07-24 MED ORDER — DEXAMETHASONE SODIUM PHOSPHATE 10 MG/ML IJ SOLN
INTRAMUSCULAR | Status: DC | PRN
  Administered 2020-07-24: 5 mg via INTRAVENOUS

## 2020-07-24 MED ORDER — CEFAZOLIN SODIUM-DEXTROSE 2-4 GM/100ML-% IV SOLN
2.0000 g | INTRAVENOUS | Status: AC
  Administered 2020-07-24: 2 g via INTRAVENOUS

## 2020-07-24 MED ORDER — KETOROLAC TROMETHAMINE 30 MG/ML IJ SOLN
INTRAMUSCULAR | Status: AC
  Filled 2020-07-24: qty 1

## 2020-07-24 MED ORDER — CEFAZOLIN SODIUM-DEXTROSE 2-4 GM/100ML-% IV SOLN
INTRAVENOUS | Status: AC
  Filled 2020-07-24: qty 100

## 2020-07-24 MED ORDER — KETOROLAC TROMETHAMINE 30 MG/ML IJ SOLN
INTRAMUSCULAR | Status: DC | PRN
  Administered 2020-07-24: 30 mg via INTRAVENOUS

## 2020-07-24 MED ORDER — LACTATED RINGERS IV SOLN: INTRAVENOUS | Status: DC

## 2020-07-24 MED ORDER — HYDROMORPHONE HCL 1 MG/ML IJ SOLN: 0.2500 mg | INTRAMUSCULAR | Status: DC | PRN

## 2020-07-24 MED ORDER — MEPERIDINE HCL 25 MG/ML IJ SOLN: 6.2500 mg | INTRAMUSCULAR | Status: DC | PRN

## 2020-07-24 MED ORDER — ONDANSETRON HCL 4 MG/2ML IJ SOLN
INTRAMUSCULAR | Status: AC
  Filled 2020-07-24: qty 2

## 2020-07-24 MED ORDER — ONDANSETRON HCL 4 MG/2ML IJ SOLN
INTRAMUSCULAR | Status: DC | PRN
  Administered 2020-07-24: 4 mg via INTRAVENOUS

## 2020-07-24 MED ORDER — OXYCODONE HCL 5 MG PO TABS: 5.0000 mg | ORAL_TABLET | Freq: Once | ORAL | Status: DC | PRN

## 2020-07-24 MED ORDER — SODIUM CHLORIDE 0.9 % IR SOLN
Status: DC | PRN
  Administered 2020-07-24: 5000 mL

## 2020-07-24 MED ORDER — MIDAZOLAM HCL 5 MG/5ML IJ SOLN
INTRAMUSCULAR | Status: DC | PRN
  Administered 2020-07-24: 2 mg via INTRAVENOUS

## 2020-07-24 MED ORDER — ONDANSETRON HCL 4 MG PO TABS: 4.0000 mg | ORAL_TABLET | Freq: Three times a day (TID) | ORAL | 0 refills | Status: DC | PRN

## 2020-07-24 MED ORDER — LIDOCAINE 2% (20 MG/ML) 5 ML SYRINGE
INTRAMUSCULAR | Status: DC | PRN
  Administered 2020-07-24: 80 mg via INTRAVENOUS

## 2020-07-24 MED ORDER — FENTANYL CITRATE (PF) 100 MCG/2ML IJ SOLN
INTRAMUSCULAR | Status: DC | PRN
  Administered 2020-07-24: 50 ug via INTRAVENOUS
  Administered 2020-07-24: 25 ug via INTRAVENOUS
  Administered 2020-07-24: 50 ug via INTRAVENOUS
  Administered 2020-07-24: 25 ug via INTRAVENOUS

## 2020-07-24 MED ORDER — AMISULPRIDE (ANTIEMETIC) 5 MG/2ML IV SOLN: 10.0000 mg | Freq: Once | INTRAVENOUS | Status: DC | PRN

## 2020-07-24 MED ORDER — PROMETHAZINE HCL 25 MG/ML IJ SOLN: 6.2500 mg | INTRAMUSCULAR | Status: DC | PRN

## 2020-07-24 MED ORDER — OXYCODONE HCL 5 MG/5ML PO SOLN
5.0000 mg | Freq: Once | ORAL | Status: DC | PRN
Start: 2020-07-24 — End: 2020-07-24

## 2020-07-24 MED ORDER — MIDAZOLAM HCL 2 MG/2ML IJ SOLN
INTRAMUSCULAR | Status: AC
  Filled 2020-07-24: qty 2

## 2020-07-24 MED ORDER — HYDROCODONE-ACETAMINOPHEN 5-325 MG PO TABS: 1.0000 | ORAL_TABLET | Freq: Four times a day (QID) | ORAL | 0 refills | Status: DC | PRN

## 2020-07-24 MED ORDER — LIDOCAINE 2% (20 MG/ML) 5 ML SYRINGE
INTRAMUSCULAR | Status: AC
  Filled 2020-07-24: qty 5

## 2020-07-24 SURGICAL SUPPLY — 44 items
ANCH SUT 2-0 5 STRL LF DISP (Miscellaneous) ×2 IMPLANT
BANDAGE ESMARK 6X9 LF (GAUZE/BANDAGES/DRESSINGS) IMPLANT
BLADE EXCALIBUR 4.0X13 (MISCELLANEOUS) IMPLANT
BLADE SHAVER TORPEDO 4X13 (MISCELLANEOUS) IMPLANT
BNDG CMPR 9X6 STRL LF SNTH (GAUZE/BANDAGES/DRESSINGS)
BNDG ELASTIC 6X5.8 VLCR STR LF (GAUZE/BANDAGES/DRESSINGS) ×2 IMPLANT
BNDG ESMARK 6X9 LF (GAUZE/BANDAGES/DRESSINGS)
BURR OVAL 8 FLU 4.0X13 (MISCELLANEOUS) IMPLANT
COOLER ICEMAN CLASSIC (MISCELLANEOUS) ×2 IMPLANT
COVER WAND RF STERILE (DRAPES) IMPLANT
CUFF TOURN SGL QUICK 34 (TOURNIQUET CUFF) ×2
CUFF TRNQT CYL 34X4.125X (TOURNIQUET CUFF) ×1 IMPLANT
CUTTER BONE 4.0MM X 13CM (MISCELLANEOUS) IMPLANT
CUTTER SUT JUGGER STITCH CU (CUTTER) ×2 IMPLANT
DRAPE ARTHROSCOPY W/POUCH 90 (DRAPES) ×2 IMPLANT
DRAPE IMP U-DRAPE 54X76 (DRAPES) ×2 IMPLANT
DRAPE U-SHAPE 47X51 STRL (DRAPES) ×2 IMPLANT
DURAPREP 26ML APPLICATOR (WOUND CARE) ×2 IMPLANT
GAUZE SPONGE 4X4 12PLY STRL (GAUZE/BANDAGES/DRESSINGS) ×2 IMPLANT
GAUZE XEROFORM 1X8 LF (GAUZE/BANDAGES/DRESSINGS) ×2 IMPLANT
GLOVE SURG LTX SZ6.5 (GLOVE) ×2 IMPLANT
GLOVE SURG LTX SZ7 (GLOVE) ×2 IMPLANT
GLOVE SURG NEOP MICRO LF SZ7.5 (GLOVE) ×2 IMPLANT
GLOVE SURG SYN 7.5  E (GLOVE) ×2
GLOVE SURG SYN 7.5 E (GLOVE) ×1 IMPLANT
GLOVE SURG UNDER POLY LF SZ7 (GLOVE) ×2 IMPLANT
GOWN STRL REIN XL XLG (GOWN DISPOSABLE) ×2 IMPLANT
GOWN STRL REUS W/ TWL LRG LVL3 (GOWN DISPOSABLE) ×1 IMPLANT
GOWN STRL REUS W/ TWL XL LVL3 (GOWN DISPOSABLE) ×1 IMPLANT
GOWN STRL REUS W/TWL LRG LVL3 (GOWN DISPOSABLE) ×2
GOWN STRL REUS W/TWL XL LVL3 (GOWN DISPOSABLE) ×2
IV NS IRRIG 3000ML ARTHROMATIC (IV SOLUTION) ×4 IMPLANT
JUGGERSTITCH IMPLANT CVD (Miscellaneous) ×4 IMPLANT
JUGGERSTITCH SLED CANNULA (MISCELLANEOUS) ×2 IMPLANT
MANIFOLD NEPTUNE II (INSTRUMENTS) ×2 IMPLANT
PACK ARTHROSCOPY DSU (CUSTOM PROCEDURE TRAY) ×2 IMPLANT
PACK BASIN DAY SURGERY FS (CUSTOM PROCEDURE TRAY) ×2 IMPLANT
PAD COLD SHLDR UNI WRAP-ON (PAD) ×2 IMPLANT
PAD COLD SHLDR WRAP-ON (PAD) ×2 IMPLANT
PAD COLD UNI WRAP-ON (PAD) ×1 IMPLANT
SHEET MEDIUM DRAPE 40X70 STRL (DRAPES) ×2 IMPLANT
SUT ETHILON 3 0 PS 1 (SUTURE) ×2 IMPLANT
TOWEL GREEN STERILE FF (TOWEL DISPOSABLE) ×2 IMPLANT
TUBING ARTHROSCOPY IRRIG 16FT (MISCELLANEOUS) ×2 IMPLANT

## 2020-07-24 NOTE — H&P (Signed)
    PREOPERATIVE H&P  Chief Complaint: right knee medial meniscal tear  HPI: Vincent Wade is a 55 y.o. male who presents for surgical treatment of right knee medial meniscal tear.  He denies any changes in medical history.  Past Medical History:  Diagnosis Date  . GERD (gastroesophageal reflux disease)    OTC meds  . MMT (medial meniscus tear)    right knee   Past Surgical History:  Procedure Laterality Date  . NO PAST SURGERIES     Social History   Socioeconomic History  . Marital status: Married    Spouse name: Not on file  . Number of children: Not on file  . Years of education: Not on file  . Highest education level: Not on file  Occupational History  . Not on file  Tobacco Use  . Smoking status: Never Smoker  . Smokeless tobacco: Never Used  Substance and Sexual Activity  . Alcohol use: Never  . Drug use: Never  . Sexual activity: Not on file  Other Topics Concern  . Not on file  Social History Narrative  . Not on file   Social Determinants of Health   Financial Resource Strain: Not on file  Food Insecurity: Not on file  Transportation Needs: Not on file  Physical Activity: Not on file  Stress: Not on file  Social Connections: Not on file   History reviewed. No pertinent family history. No Known Allergies Prior to Admission medications   Medication Sig Start Date End Date Taking? Authorizing Provider  ibuprofen (ADVIL) 400 MG tablet Take 400 mg by mouth every 6 (six) hours as needed.   Yes [provider]     Positive ROS: All other systems have been reviewed and were otherwise negative with the exception of those mentioned in the HPI and as above.  Physical Exam: General: Alert, no acute distress Cardiovascular: No pedal edema Respiratory: No cyanosis, no use of accessory musculature GI: abdomen soft Skin: No lesions in the area of chief complaint Neurologic: Sensation intact distally Psychiatric: Patient is competent for consent with  normal mood and affect Lymphatic: no lymphedema  MUSCULOSKELETAL: exam stable  Assessment: right knee medial meniscal tear  Plan: Plan for Procedure(s): RIGHT KNEE ARTHROSCOPY WITH PARTIAL MEDIAL MENISCECTOMY  The risks benefits and alternatives were discussed with the patient including but not limited to the risks of nonoperative treatment, versus surgical intervention including infection, bleeding, nerve injury,  blood clots, cardiopulmonary complications, morbidity, mortality, among others, and they were willing to proceed.   Preoperative templating of the joint replacement has been completed, documented, and submitted to the Operating Room personnel in order to optimize intra-operative equipment management.   Glee Arvin, MD 07/24/2020 1:20 PM

## 2020-07-24 NOTE — Discharge Instructions (Signed)
Next dose of NSAID (Ibuprofen/Motrin/Aleve) can be given at 8:30pm if needed.   Post Anesthesia Home Care Instructions  Activity: Get plenty of rest for the remainder of the day. A responsible individual must stay with you for 24 hours following the procedure.  For the next 24 hours, DO NOT: -Drive a car -Advertising copywriter -Drink alcoholic beverages -Take any medication unless instructed by your physician -Make any legal decisions or sign important papers.  Meals: Start with liquid foods such as gelatin or soup. Progress to regular foods as tolerated. Avoid greasy, spicy, heavy foods. If nausea and/or vomiting occur, drink only clear liquids until the nausea and/or vomiting subsides. Call your physician if vomiting continues.  Special Instructions/Symptoms: Your throat may feel dry or sore from the anesthesia or the breathing tube placed in your throat during surgery. If this causes discomfort, gargle with warm salt water. The discomfort should disappear within 24 hours.           Post-operative patient instructions  Knee Arthroscopy   . Ice:  Place intermittent ice or cooler pack over your knee, 30 minutes on and 30 minutes off.  Continue this for the first 72 hours after surgery, then save ice for use after therapy sessions or on more active days.   . Weight:  You may bear weight on your leg as your symptoms allow. . Crutches:  Use crutches (or walker) to assist in walking until told to discontinue by your physical therapist or physician. This will help to reduce pain. . Strengthening:  Perform simple thigh squeezes (isometric quad contractions) and straight leg lifts as you are able (3 sets of 5 to 10 repetitions, 3 times a day).  For the leg lifts, have someone support under your ankle in the beginning until you have increased strength enough to do this on your own.  To help get started on thigh squeezes, place a pillow under your knee and push down on the pillow with back of  knee (sometimes easier to do than with your leg fully straight). . Motion:  Perform gentle knee motion as tolerated - this is gentle bending and straightening of the knee. Seated heel slides: you can start by sitting in a chair, remove your brace, and gently slide your heel back on the floor - allowing your knee to bend. Have someone help you straighten your knee (or use your other leg/foot hooked under your ankle.  . Dressing:  Perform 1st dressing change at 2 days postoperative. A moderate amount of blood tinged drainage is to be expected.  So if you bleed through the dressing on the first or second day or if you have fevers, it is fine to change the dressing/check the wounds early and redress wound. Elevate your leg.  If it bleeds through again, or if the incisions are leaking frank blood, please call the office. May change dressing every 1-2 days thereafter to help watch wounds. Can purchase Tegaderm (or 57M Nexcare) water resistant dressings at local pharmacy / [facility]. . Shower:  Light shower is ok after 2 days.  Please take shower, NO bath. Recover with gauze and ace wrap to help keep wounds protected.   . Pain medication:  A narcotic pain medication has been prescribed.  Take as directed.  Typically you need narcotic pain medication more regularly during the first 3 to 5 days after surgery.  Decrease your use of the medication as the pain improves.  Narcotics can sometimes cause constipation, even after a few doses.  If you have problems with constipation, you can take an over the counter stool softener or light laxative.  If you have persistent problems, please notify your physician's office. Marland Kitchen Physical therapy: Additional activity guidelines to be provided by your physician or physical therapist at follow-up visits.  . Driving: Do not recommend driving x 2 weeks post surgical, especially if surgery performed on right side. Should not drive while taking narcotic pain medications. It typically takes at  least 2 weeks to restore sufficient neuromuscular function for normal reaction times for driving safety.  . Call [phone] for questions or problems. Evenings you will be forwarded to the hospital operator.  Ask for the orthopaedic physician on call. Please call if you experience:    o Redness, foul smelling, or persistent drainage from the surgical site  o worsening knee pain and swelling not responsive to medication  o any calf pain and or swelling of the lower leg  o temperatures greater than 101.5 F o other questions or concerns   Thank you for allowing [place] to be a part of your care.

## 2020-07-24 NOTE — Anesthesia Postprocedure Evaluation (Signed)
Anesthesia Post Note  Patient: Vincent Wade  Procedure(s) Performed: RIGHT KNEE ARTHROSCOPY WITH MEDIAL MENISCUS REPAIR (Right Knee)     Patient location during evaluation: PACU Anesthesia Type: General Level of consciousness: awake and alert Pain management: pain level controlled Vital Signs Assessment: post-procedure vital signs reviewed and stable Respiratory status: spontaneous breathing, nonlabored ventilation, respiratory function stable and patient connected to nasal cannula oxygen Cardiovascular status: blood pressure returned to baseline and stable Postop Assessment: no apparent nausea or vomiting Anesthetic complications: no   No complications documented.  Last Vitals:  Vitals:   07/24/20 1500 07/24/20 1535  BP: 128/84 133/79  Pulse: 71 71  Resp: 17 16  Temp:  36.4 C  SpO2: 98% 99%    Last Pain:  Vitals:   07/24/20 1515  TempSrc:   PainSc: 0-No pain                 [name] S

## 2020-07-24 NOTE — Transfer of Care (Signed)
Immediate Anesthesia Transfer of Care Note  Patient: Vincent Wade  Procedure(s) Performed: RIGHT KNEE ARTHROSCOPY WITH MEDIAL MENISCUS REPAIR (Right Knee)  Patient Location: PACU  Anesthesia Type:General  Level of Consciousness: drowsy and patient cooperative  Airway & Oxygen Therapy: Patient Spontanous Breathing and Patient connected to nasal cannula oxygen  Post-op Assessment: Report given to RN and Post -op Vital signs reviewed and stable  Post vital signs: Reviewed and stable  Last Vitals:  Vitals Value Taken Time  BP 132/84 07/24/20 1442  Temp 36.1 C 07/24/20 1442  Pulse 63 07/24/20 1444  Resp 14 07/24/20 1444  SpO2 100 % 07/24/20 1444  Vitals shown include unvalidated device data.  Last Pain:  Vitals:   07/24/20 1303  TempSrc: Oral  PainSc: 5          Complications: No complications documented.

## 2020-07-24 NOTE — Anesthesia Procedure Notes (Signed)
Procedure Name: LMA Insertion Date/Time: 07/24/2020 1:53 PM Performed by: Marny Lowenstein, CRNA Pre-anesthesia Checklist: Patient identified, Emergency Drugs available, Suction available and Patient being monitored Patient Re-evaluated:Patient Re-evaluated prior to induction Oxygen Delivery Method: Circle system utilized Preoxygenation: Pre-oxygenation with 100% oxygen Induction Type: IV induction Ventilation: Mask ventilation without difficulty LMA: LMA inserted LMA Size: 4.0 Number of attempts: 1 Placement Confirmation: positive ETCO2 and breath sounds checked- equal and bilateral Tube secured with: Tape Dental Injury: Teeth and Oropharynx as per pre-operative assessment

## 2020-07-24 NOTE — Anesthesia Preprocedure Evaluation (Signed)
Anesthesia Evaluation  Patient identified by MRN, date of birth, ID band Patient awake    Reviewed: Allergy & Precautions, H&P , NPO status , Patient's Chart, lab work & pertinent test results  Airway Mallampati: II  TM Distance: >3 FB Neck ROM: Full    Dental no notable dental hx.    Pulmonary neg pulmonary ROS,    Pulmonary exam normal breath sounds clear to auscultation       Cardiovascular negative cardio ROS Normal cardiovascular exam Rhythm:Regular Rate:Normal     Neuro/Psych negative neurological ROS  negative psych ROS   GI/Hepatic Neg liver ROS, GERD  ,  Endo/Other  negative endocrine ROS  Renal/GU negative Renal ROS  negative genitourinary   Musculoskeletal negative musculoskeletal ROS (+)   Abdominal   Peds negative pediatric ROS (+)  Hematology negative hematology ROS (+)   Anesthesia Other Findings   Reproductive/Obstetrics negative OB ROS                             Anesthesia Physical Anesthesia Plan  ASA: II  Anesthesia Plan: General   Post-op Pain Management:    Induction: Intravenous  PONV Risk Score and Plan: 2 and Ondansetron, Midazolam and Treatment may vary due to age or medical condition  Airway Management Planned: LMA  Additional Equipment:   Intra-op Plan:   Post-operative Plan: Extubation in OR  Informed Consent: I have reviewed the patients History and Physical, chart, labs and discussed the procedure including the risks, benefits and alternatives for the proposed anesthesia with the patient or authorized representative who has indicated his/her understanding and acceptance.     Dental advisory given  Plan Discussed with: CRNA  Anesthesia Plan Comments:         Anesthesia Quick Evaluation

## 2020-07-24 NOTE — Op Note (Addendum)
   Surgery Date: 07/24/2020  PREOPERATIVE DIAGNOSES:  1. Right knee medial meniscus tear  POSTOPERATIVE DIAGNOSES:  same  PROCEDURES PERFORMED:  1. Right knee arthroscopy with limited synovectomy 2. Right knee arthroscopy with arthroscopic medial meniscal repair 3. Right knee arthroscopy with arthroscopic chondroplasty medial femoral condyle and medial tibial plateau.  SURGEON: N. Glee Arvin, M.D.  ASSIST: Starlyn Skeans [place], [place]; necessary for the timely completion of procedure and due to complexity of procedure.  ANESTHESIA:  general  FLUIDS: Per anesthesia record.   ESTIMATED BLOOD LOSS: minimal  DESCRIPTION OF PROCEDURE: [name] is a 55 y.o.-year-old male with above mentioned conditions. Full discussion held regarding risks benefits alternatives and complications related surgical intervention. Conservative care options reviewed. All questions answered.  The patient was identified in the preoperative holding area and the operative extremity was marked. The patient was brought to the operating room and transferred to operating table in a supine position. Satisfactory general anesthesia was induced by anesthesiology.    Standard anterolateral, anteromedial arthroscopy portals were obtained. The anteromedial portal was obtained with a spinal needle for localization under direct visualization with subsequent diagnostic findings.   Incisions were made for knee arthroscopy portals.  Diagnostic knee arthroscopy with limited synovectomy was performed for exposure.  There was some mild synovitis.  No loose bodies.  Cartilage of the medial lateral and patellofemoral compartments were all unremarkable.  With a gentle valgus force we then opened up the medial compartment and found a horizontal tear of the posterior horn the medial meniscus.  The superior portion of the meniscus was first debrided back with meniscus basket but it was evident that the tear went all the way through to the  back which would have required removal of the entire posterior horn.  I felt that this tear was amenable to an all inside repair.  After I debrided the edges of the tear I then used the [employer] all inside meniscal repair device.  2 vertical mattress sutures were placed and allowed for closure of the tear.  I was very happy with how the flaps came together.  Gutters were checked for loose bodies.  Excess fluid was removed from the knee joint.  Incisions were closed with interrupted nylon sutures.  Sterile dressings were applied.  Patient tolerated procedure well had no immediate complications.  Suprapatellar pouch and gutters: mild synovitis or debris. Patella chondral surface: Grade 0 Trochlear chondral surface: Grade 0 Patellofemoral tracking: normal Medial meniscus: horizontal tear posterior horn.  Medial femoral condyle weight bearing surface: Grade 0 Medial tibial plateau: Grade 0 Anterior cruciate ligament:stable Posterior cruciate ligament:stable Lateral meniscus: normal.   Lateral femoral condyle weight bearing surface: Grade 0 Lateral tibial plateau: Grade 0  [name] was critical for opening, closing, limb positioning and overall facilitation and timely completion of the surgery.  DISPOSITION: The patient was awakened from general anesthetic, extubated, taken to the recovery room in medically stable condition, no apparent complications. The patient may be weightbearing as tolerated to the operative lower extremity.  Range of motion of right knee as tolerated.  [name], MD [facility] 2:31 PM

## 2020-07-25 ENCOUNTER — Encounter (HOSPITAL_BASED_OUTPATIENT_CLINIC_OR_DEPARTMENT_OTHER): Payer: Self-pay | Admitting: Orthopaedic Surgery

## 2020-08-06 ENCOUNTER — Ambulatory Visit (INDEPENDENT_AMBULATORY_CARE_PROVIDER_SITE_OTHER): Payer: 59 | Admitting: Orthopaedic Surgery

## 2020-08-06 ENCOUNTER — Encounter: Payer: Self-pay | Admitting: Orthopaedic Surgery

## 2020-08-06 ENCOUNTER — Other Ambulatory Visit: Payer: Self-pay

## 2020-08-06 DIAGNOSIS — S83241A Other tear of medial meniscus, current injury, right knee, initial encounter: Secondary | ICD-10-CM

## 2020-08-06 MED ORDER — TRAMADOL HCL 50 MG PO TABS: 50.0000 mg | ORAL_TABLET | Freq: Every day | ORAL | 0 refills | Status: DC | PRN

## 2020-08-06 NOTE — Progress Notes (Signed)
   Post-Op Visit Note   Patient: Vincent Wade           Date of Birth: 19-Aug-1965           MRN: [id number] Visit Date: 08/06/2020 PCP: Georgina Quint, MD   Assessment & Plan:  Chief Complaint:  Chief Complaint  Patient presents with  . Right Knee - Routine Post Op   Visit Diagnoses:  1. Acute medial meniscus tear of right knee, initial encounter     Plan:   [name] is 1 week status post right knee scope medial meniscal repair.  He is overall doing okay.  He would like something weaker than hydrocodone.  He is currently taking Tylenol and Advil as needed.  He is having some pain with weightbearing so he is still using crutches.  Right knee shows fully healed incisions.  Trace effusion.  Mild bruising.  No calf tenderness.  Range of motion of the knee is 0 to 95 degrees.  Arthroscopy pictures reviewed with the patient and his wife today.  I sent in prescription for tramadol.  I also made a referral to outpatient physical therapy to help with strengthening and gait training.  Recheck in 4 weeks.  Follow-Up Instructions: Return in about 4 weeks (around 09/03/2020).   Orders:  Orders Placed This Encounter  Procedures  . Ambulatory referral to Physical Therapy   Meds ordered this encounter  Medications  . traMADol (ULTRAM) 50 MG tablet    Sig: Take 1-2 tablets (50-100 mg total) by mouth daily as needed.    Dispense:  20 tablet    Refill:  0    Imaging: No results found.  PMFS History: Patient Active Problem List   Diagnosis Date Noted  . Acute medial meniscus tear of right knee 07/09/2020   Past Medical History:  Diagnosis Date  . GERD (gastroesophageal reflux disease)    OTC meds  . MMT (medial meniscus tear)    right knee    History reviewed. No pertinent family history.  Past Surgical History:  Procedure Laterality Date  . KNEE ARTHROSCOPY WITH MENISCAL REPAIR Right 07/24/2020   Procedure: RIGHT KNEE ARTHROSCOPY WITH MEDIAL MENISCUS REPAIR;  Surgeon: Tarry Kos, MD;  Location: [facility];  Service: Orthopedics;  Laterality: Right;  . NO PAST SURGERIES     Social History   Occupational History  . Not on file  Tobacco Use  . Smoking status: Never Smoker  . Smokeless tobacco: Never Used  Substance and Sexual Activity  . Alcohol use: Never  . Drug use: Never  . Sexual activity: Not on file

## 2020-08-21 ENCOUNTER — Encounter: Payer: Self-pay | Admitting: Physical Therapy

## 2020-08-21 ENCOUNTER — Other Ambulatory Visit: Payer: Self-pay

## 2020-08-21 ENCOUNTER — Ambulatory Visit: Payer: 59 | Attending: Orthopaedic Surgery | Admitting: Physical Therapy

## 2020-08-21 DIAGNOSIS — M25561 Pain in right knee: Secondary | ICD-10-CM | POA: Insufficient documentation

## 2020-08-21 DIAGNOSIS — R600 Localized edema: Secondary | ICD-10-CM | POA: Insufficient documentation

## 2020-08-21 DIAGNOSIS — R262 Difficulty in walking, not elsewhere classified: Secondary | ICD-10-CM | POA: Insufficient documentation

## 2020-08-21 DIAGNOSIS — M25661 Stiffness of right knee, not elsewhere classified: Secondary | ICD-10-CM | POA: Insufficient documentation

## 2020-08-21 NOTE — Therapy (Signed)
[facility] [address] [place], [place], [address] Phone: [phone]   Fax:  [phone]  Physical Therapy Evaluation  Patient Details  Name: Vincent Wade MRN: [id number] Date of Birth: 06/06/1965 Referring Provider (PT): Haynes Kerns Date: 08/21/2020   PT End of Session - 08/21/20 1041    Visit Number 1    Number of Visits 20    Date for PT Re-Evaluation 10/21/20    PT Start Time 1010    PT Stop Time 1050    PT Time Calculation (min) 40 min    Activity Tolerance Patient tolerated treatment well    Behavior During Therapy [facility] for tasks assessed/performed           Past Medical History:  Diagnosis Date  . GERD (gastroesophageal reflux disease)    OTC meds  . MMT (medial meniscus tear)    right knee    Past Surgical History:  Procedure Laterality Date  . KNEE ARTHROSCOPY WITH MENISCAL REPAIR Right 07/24/2020   Procedure: RIGHT KNEE ARTHROSCOPY WITH MEDIAL MENISCUS REPAIR;  Surgeon: Tarry Kos, MD;  Location: [facility];  Service: Orthopedics;  Laterality: Right;  . NO PAST SURGERIES      There were no vitals filed for this visit.    Subjective Assessment - 08/21/20 1015    Subjective Patient underwent a right medial meniscus repair 07/24/20, he injured the knee lifting furniture in January.  He was NWB for about 3 weeks and then has started with WBing per the MD on 08/06/20.  He reports that he did not have a brace on.    Limitations Lifting;Standing;Walking;House hold activities    Patient Stated Goals have less pain, better motion and walk without difficulty    Currently in Pain? Yes    Pain Score 5     Pain Location Knee    Pain Orientation Right    Pain Descriptors / Indicators Aching;Sore    Pain Type Acute pain;Surgical pain    Pain Onset 1 to 4 weeks ago    Pain Frequency Constant    Aggravating Factors  walking sitting, bending the leg standing pain i sup to 7/10    Pain Relieving  Factors rest, rubbing the knee ice, some pain medication, 3/10 at best    Effect of Pain on Daily Activities limits everything              [facility] PT Assessment - 08/21/20 0001      Assessment   Medical Diagnosis s/p right medial meniscus repair    Referring Provider (PT) Xu    Onset Date/Surgical Date 07/24/20    Prior Therapy no      Precautions   Precautions None      Balance Screen   Has the patient fallen in the past 6 months No    Has the patient had a decrease in activity level because of a fear of falling?  No    Is the patient reluctant to leave their home because of a fear of falling?  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    Leisure some walking      ROM /  Strength   AROM / PROM / Strength AROM;PROM;Strength      AROM   AROM Assessment Site Knee    Right/Left Knee Right    Right Knee Extension 14    Right Knee Flexion 103      PROM   PROM Assessment Site Knee    Right/Left Knee Right    Right Knee Extension 10    Right Knee Flexion 108      Strength   Strength Assessment Site Knee    Right/Left Knee Right    Right Knee Flexion 4-/5    Right Knee Extension 3+/5      Palpation   Palpation comment mild tenderness in the patellar area, some swelling behind the patella      Ambulation/Gait   Gait Comments uses a SPC, slow, very antalgic and stiff, used the cane in the wrong hand                      Objective measurements completed on examination: See above findings.       [facility] Adult PT Treatment/Exercise - 08/21/20 0001      Exercises   Exercises Knee/Hip      Knee/Hip Exercises: Aerobic   Nustep level 1 x 5 minutes                  PT Education - 08/21/20 1040     Education Details educated on the surgery and how to protect the knee, RICE, educated in proper gait    Person(s) Educated Patient    Methods Explanation;Demonstration    Comprehension Verbalized understanding            PT Short Term Goals - 08/21/20 1049      PT SHORT TERM GOAL #1   Title indepednent with RICE    Time 2    Period Weeks    Status New             PT Long Term Goals - 08/21/20 1049      PT LONG TERM GOAL #1   Title independent with HEP    Time 8    Period Weeks    Status New      PT LONG TERM GOAL #2   Title report pain decrease 50%    Time 8    Period Weeks    Status New      PT LONG TERM GOAL #3   Title go up and down stairs without diffiuclty step over step    Time 8    Period Weeks    Status New      PT LONG TERM GOAL #4   Title increase AROM to WNL's    Time 8    Period Weeks    Status New      PT LONG TERM GOAL #5   Title ambulate without device and without deviation    Time 8    Period Weeks    Status New                  Plan - 08/21/20 1042    Clinical Impression Statement Patient injured his right knee in January, he underwent a right medial meniscus repair 07/24/20.  He reports being NWBing for about 2-3 weeks.  He has limited ROM, some fear of moving, poor gait pattern, needs education on slow preogress with the torn meniscus.  There is swelling behind the patella, mild tenderness, tight calf  Stability/Clinical Decision Making Stable/Uncomplicated    Clinical Decision Making Low    Rehab Potential Good    PT Frequency 2x / week    PT Duration 8 weeks    PT Treatment/Interventions ADLs/Self Care Home Management;Cryotherapy;Electrical Stimulation;Gait training;Neuromuscular re-education;Balance training;Therapeutic exercise;Therapeutic activities;Functional mobility training;Stair training;Patient/family education;Manual techniques;Vasopneumatic Device    PT Next Visit Plan slowly progress ROM and fucntion, go slow  due to Meniscus repair    Consulted and Agree with Plan of Care Patient           Patient will benefit from skilled therapeutic intervention in order to improve the following deficits and impairments:  Abnormal gait,Decreased range of motion,Difficulty walking,Decreased activity tolerance,Pain,Decreased balance,Impaired flexibility,Increased edema,Decreased strength,Decreased mobility  Visit Diagnosis: Acute pain of right knee - Plan: PT plan of care cert/re-cert  Stiffness of right knee, not elsewhere classified - Plan: PT plan of care cert/re-cert  Localized edema - Plan: PT plan of care cert/re-cert  Difficulty in walking, not elsewhere classified - Plan: PT plan of care cert/re-cert     Problem List Patient Active Problem List   Diagnosis Date Noted  . Acute medial meniscus tear of right knee 07/09/2020    Jearld Lesch., PT 08/21/2020, 10:54 AM  [facility] [address] [place], [place], [address] Phone: [phone]   Fax:  [phone]  Name: Vincent Wade MRN: [id number] Date of Birth: [birth year]

## 2020-09-02 ENCOUNTER — Ambulatory Visit: Payer: 59 | Attending: Orthopaedic Surgery | Admitting: Physical Therapy

## 2020-09-02 ENCOUNTER — Encounter: Payer: Self-pay | Admitting: Physical Therapy

## 2020-09-02 ENCOUNTER — Other Ambulatory Visit: Payer: Self-pay

## 2020-09-02 DIAGNOSIS — M25561 Pain in right knee: Secondary | ICD-10-CM | POA: Diagnosis present

## 2020-09-02 DIAGNOSIS — R600 Localized edema: Secondary | ICD-10-CM | POA: Diagnosis present

## 2020-09-02 DIAGNOSIS — R262 Difficulty in walking, not elsewhere classified: Secondary | ICD-10-CM | POA: Diagnosis present

## 2020-09-02 DIAGNOSIS — M25661 Stiffness of right knee, not elsewhere classified: Secondary | ICD-10-CM | POA: Insufficient documentation

## 2020-09-02 NOTE — Therapy (Signed)
[place] [place], [place], [address] Phone: [phone]   Fax:  [phone]  Physical Therapy Treatment  Patient Details  Name: Christyan Reger MRN: [id number] Date of Birth: [birth year] Referring Provider (PT): Erlinda Hong   Encounter Date: 09/02/2020   PT End of Session - 09/02/20 1140    Visit Number 2    Number of Visits 20    Date for PT Re-Evaluation 10/21/20    PT Start Time 1011    PT Stop Time 1057    PT Time Calculation (min) 46 min    Activity Tolerance Patient tolerated treatment well    Behavior During Therapy [facility] for tasks assessed/performed           Past Medical History:  Diagnosis Date  . GERD (gastroesophageal reflux disease)    OTC meds  . MMT (medial meniscus tear)    right knee    Past Surgical History:  Procedure Laterality Date  . KNEE ARTHROSCOPY WITH MENISCAL REPAIR Right 07/24/2020   Procedure: RIGHT KNEE ARTHROSCOPY WITH MEDIAL MENISCUS REPAIR;  Surgeon: Leandrew Koyanagi, MD;  Location: [facility];  Service: Orthopedics;  Laterality: Right;  . NO PAST SURGERIES      There were no vitals filed for this visit.   Subjective Assessment - 09/02/20 1014    Subjective Patient reports that he is doing better and walking better, still slow and some pain with bending, "feels stiff"    Currently in Pain? Yes    Pain Score 2     Pain Location Knee    Pain Orientation Right    Pain Descriptors / Indicators Tightness    Aggravating Factors  walking, bending              [facility] PT Assessment - 09/02/20 0001      AROM   Right Knee Extension 9    Right Knee Flexion 112                         [facility] Adult PT Treatment/Exercise - 09/02/20 0001      Ambulation/Gait   Gait Comments 4" stairs step over step with handrails      High Level Balance   High Level Balance Activities Backward walking;Side stepping    High Level Balance Comments on airex ball toss, eyes  closed head turns, airex balance beam tandem walk and side stepping      Knee/Hip Exercises: Aerobic   Recumbent Bike level 1 x 6 minutes, partial revolutions    Nustep level 4 x 6 minutes      Knee/Hip Exercises: Standing   Terminal Knee Extension Right;2 sets;10 reps    Terminal Knee Extension Limitations ball behind the knee    Lateral Step Up Step Height: 4";15 reps    Forward Step Up Step Height: 2";Step Height: 4";15 reps    Step Down Step Height: 2";Step Height: 4";15 reps      Knee/Hip Exercises: Supine   Quad Sets Right;2 sets;10 reps    Quad Sets Limitations 3#    Other Supine Knee/Hip Exercises feet on ball K2C, bridge                    PT Short Term Goals - 08/21/20 1049      PT SHORT TERM GOAL #1   Title indepednent with RICE    Time 2    Period Weeks    Status  New             PT Long Term Goals - 09/02/20 1144      PT LONG TERM GOAL #1   Title independent with HEP    Status On-going      PT LONG TERM GOAL #2   Title report pain decrease 50%    Status On-going      PT LONG TERM GOAL #3   Title go up and down stairs without diffiuclty step over step    Status Partially Met                 Plan - 09/02/20 1141    Clinical Impression Statement Patient has gained about 14 degrees of ROM over the past 2 weeks.  He is still stiff and c/o a catch over the patellar tendon with bending.  He is walking better with the SPC, less antalgic.  Was able to do 4" steps today step over step up and down.  We have been careful and cautious due to the repair.  At 6 weeks post op we will gradually increase strength and activities as MD allows    PT Next Visit Plan Please advise if there are any restrictions that you have for your meniscus repair protocol.  Thank you    Consulted and Agree with Plan of Care Patient           Patient will benefit from skilled therapeutic intervention in order to improve the following deficits and impairments:  Abnormal  gait,Decreased range of motion,Difficulty walking,Decreased activity tolerance,Pain,Decreased balance,Impaired flexibility,Increased edema,Decreased strength,Decreased mobility  Visit Diagnosis: Acute pain of right knee  Stiffness of right knee, not elsewhere classified  Localized edema  Difficulty in walking, not elsewhere classified     Problem List Patient Active Problem List   Diagnosis Date Noted  . Acute medial meniscus tear of right knee 07/09/2020    Sumner Boast., PT 09/02/2020, 11:45 AM  [place]. [place], [place], [address] Phone: [phone]   Fax:  [phone]  Name: Duane Earnshaw MRN: [id number] Date of Birth: [birth year]

## 2020-09-03 ENCOUNTER — Ambulatory Visit (INDEPENDENT_AMBULATORY_CARE_PROVIDER_SITE_OTHER): Payer: 59 | Admitting: Physician Assistant

## 2020-09-03 ENCOUNTER — Encounter: Payer: Self-pay | Admitting: Orthopaedic Surgery

## 2020-09-03 DIAGNOSIS — Z9889 Other specified postprocedural states: Secondary | ICD-10-CM

## 2020-09-03 MED ORDER — TRAMADOL HCL 50 MG PO TABS: 50.0000 mg | ORAL_TABLET | Freq: Two times a day (BID) | ORAL | 0 refills | Status: DC | PRN

## 2020-09-03 NOTE — Progress Notes (Signed)
   Post-Op Visit Note   Patient: Vincent Wade           Date of Birth: Aug 21, 1965           MRN: [id number] Visit Date: 09/03/2020 PCP: Georgina Quint, MD   Assessment & Plan:  Chief Complaint:  Chief Complaint  Patient presents with  . Right Knee - Routine Post Op   Visit Diagnoses:  1. S/P right knee arthroscopy     Plan: Patient is a pleasant 55 year old Spanish-speaking gentleman who comes in today 6 weeks out right knee medial meniscus repair.  He has been doing okay.  He is still complaining of some pain primarily to the medial knee, but this has improved over the past several weeks.  He is in physical therapy regaining range of motion and strength.  Examination of his right knee reveals fully healed surgical portals without complication.  He does have a mild effusion.  Range of motion 0 to 110 degrees.  He is neurovascular intact distally.  At this point, we will have him continue with physical therapy.  I have agreed to call in tramadol to take as needed.  He will follow up with [place] in 6 weeks time for recheck.  This was all discussed through Spanish-speaking interpreter.  Follow-Up Instructions: Return in about 6 weeks (around 10/15/2020).   Orders:  No orders of the defined types were placed in this encounter.  Meds ordered this encounter  Medications  . traMADol (ULTRAM) 50 MG tablet    Sig: Take 1 tablet (50 mg total) by mouth every 12 (twelve) hours as needed.    Dispense:  20 tablet    Refill:  0    Imaging: No new imaging  PMFS History: Patient Active Problem List   Diagnosis Date Noted  . Acute medial meniscus tear of right knee 07/09/2020   Past Medical History:  Diagnosis Date  . GERD (gastroesophageal reflux disease)    OTC meds  . MMT (medial meniscus tear)    right knee    History reviewed. No pertinent family history.  Past Surgical History:  Procedure Laterality Date  . KNEE ARTHROSCOPY WITH MENISCAL REPAIR Right 07/24/2020   Procedure:  RIGHT KNEE ARTHROSCOPY WITH MEDIAL MENISCUS REPAIR;  Surgeon: Tarry Kos, MD;  Location: [facility];  Service: Orthopedics;  Laterality: Right;  . NO PAST SURGERIES     Social History   Occupational History  . Not on file  Tobacco Use  . Smoking status: Never Smoker  . Smokeless tobacco: Never Used  Substance and Sexual Activity  . Alcohol use: Never  . Drug use: Never  . Sexual activity: Not on file

## 2020-09-04 ENCOUNTER — Encounter: Payer: Self-pay | Admitting: Physical Therapy

## 2020-09-04 ENCOUNTER — Ambulatory Visit: Payer: 59 | Admitting: Physical Therapy

## 2020-09-04 ENCOUNTER — Other Ambulatory Visit: Payer: Self-pay

## 2020-09-04 DIAGNOSIS — M25561 Pain in right knee: Secondary | ICD-10-CM | POA: Diagnosis not present

## 2020-09-04 DIAGNOSIS — R262 Difficulty in walking, not elsewhere classified: Secondary | ICD-10-CM

## 2020-09-04 DIAGNOSIS — R600 Localized edema: Secondary | ICD-10-CM

## 2020-09-04 DIAGNOSIS — M25661 Stiffness of right knee, not elsewhere classified: Secondary | ICD-10-CM

## 2020-09-04 NOTE — Therapy (Signed)
[place]. [place], [place], [address] Phone: [phone]   Fax:  [phone]  Physical Therapy Treatment  Patient Details  Name: Domonick Sittner MRN: [id number] Date of Birth: 1966-05-09 Referring Provider (PT): Purcell Mouton Date: 09/04/2020   PT End of Session - 09/04/20 1202    Visit Number 3    Number of Visits 20    Date for PT Re-Evaluation 10/21/20    PT Start Time 1010    PT Stop Time 1055    PT Time Calculation (min) 45 min    Activity Tolerance Patient tolerated treatment well    Behavior During Therapy [facility] for tasks assessed/performed;Anxious           Past Medical History:  Diagnosis Date  . GERD (gastroesophageal reflux disease)    OTC meds  . MMT (medial meniscus tear)    right knee    Past Surgical History:  Procedure Laterality Date  . KNEE ARTHROSCOPY WITH MENISCAL REPAIR Right 07/24/2020   Procedure: RIGHT KNEE ARTHROSCOPY WITH MEDIAL MENISCUS REPAIR;  Surgeon: Leandrew Koyanagi, MD;  Location: [facility];  Service: Orthopedics;  Laterality: Right;  . NO PAST SURGERIES      There were no vitals filed for this visit.   Subjective Assessment - 09/04/20 1011    Subjective c/o some upper lateral calf pain on the right    Currently in Pain? Yes    Pain Score 3     Pain Location Knee    Pain Orientation Right;Posterior    Pain Descriptors / Indicators Sore                             [facility] Adult PT Treatment/Exercise - 09/04/20 0001      Ambulation/Gait   Gait Comments 4" stairs step over step with handrails      High Level Balance   High Level Balance Comments on airex ball toss, eyes closed head turns, airex balance beam tandem walk and side stepping, ball kicks, minitramp march and balance      Knee/Hip Exercises: Stretches   Press photographer Both;4 reps;20 seconds      Knee/Hip Exercises: Aerobic   Recumbent Bike level 1 6 minutes full  revolutions    Nustep level 4 x 6 minutes      Knee/Hip Exercises: Machines for Strengthening   Total Gym Leg Press 40# with both legs blue tband behind the right for better TKE      Knee/Hip Exercises: Standing   Terminal Knee Extension Right;2 sets;10 reps    Terminal Knee Extension Limitations ball behind the knee    Lateral Step Up Step Height: 4";15 reps    Forward Step Up Step Height: 4";15 reps    Step Down Step Height: 4";15 reps      Knee/Hip Exercises: Seated   Other Seated Knee/Hip Exercises fitter extension with one black band      Knee/Hip Exercises: Supine   Quad Sets Right;2 sets;10 reps    Quad Sets Limitations 3#                    PT Short Term Goals - 08/21/20 1049      PT SHORT TERM GOAL #1   Title indepednent with RICE    Time 2    Period Weeks    Status New  PT Long Term Goals - 09/04/20 1208      PT LONG TERM GOAL #1   Title independent with HEP    Status On-going      PT LONG TERM GOAL #2   Title report pain decrease 50%    Status Partially Met      PT LONG TERM GOAL #3   Title go up and down stairs without diffiuclty step over step    Status Partially Met      PT LONG TERM GOAL #4   Title increase AROM to WNL's    Status Partially Met      PT LONG TERM GOAL #5   Title ambulate without device and without deviation    Status On-going                 Plan - 09/04/20 1206    Clinical Impression Statement Patient is doing very well, ROM and gait are improving, he still has tightness in the calves and with knee bending.  He has had mild pain with the PT intervention.    PT Next Visit Plan continue to work on strength, ROM and gait    Consulted and Agree with Plan of Care Patient           Patient will benefit from skilled therapeutic intervention in order to improve the following deficits and impairments:  Abnormal gait,Decreased range of motion,Difficulty walking,Decreased activity  tolerance,Pain,Decreased balance,Impaired flexibility,Increased edema,Decreased strength,Decreased mobility  Visit Diagnosis: Acute pain of right knee  Stiffness of right knee, not elsewhere classified  Localized edema  Difficulty in walking, not elsewhere classified     Problem List Patient Active Problem List   Diagnosis Date Noted  . Acute medial meniscus tear of right knee 07/09/2020    Sumner Boast., PT 09/04/2020, 12:09 PM  [place]. [place], [place], [address] Phone: [phone]   Fax:  [phone]  Name: Rajendra Spiller MRN: [id number] Date of Birth: 1965/06/03

## 2020-09-05 ENCOUNTER — Other Ambulatory Visit: Payer: Self-pay

## 2020-09-05 ENCOUNTER — Encounter (HOSPITAL_COMMUNITY): Payer: Self-pay | Admitting: Emergency Medicine

## 2020-09-05 ENCOUNTER — Ambulatory Visit (HOSPITAL_COMMUNITY)
Admission: EM | Admit: 2020-09-05 | Discharge: 2020-09-05 | Disposition: A | Discharge status: Home / Self Care | Payer: 59 | Attending: Internal Medicine | Admitting: Internal Medicine

## 2020-09-05 DIAGNOSIS — N489 Disorder of penis, unspecified: Secondary | ICD-10-CM | POA: Diagnosis not present

## 2020-09-05 MED ORDER — NAPROXEN 500 MG PO TABS: 500.0000 mg | ORAL_TABLET | Freq: Two times a day (BID) | ORAL | 0 refills | Status: DC

## 2020-09-05 NOTE — Discharge Instructions (Signed)
Please call [facility] for a consultation regarding procedural removal of your 1cm skin growth over the penis. In the meantime, please take naproxen for pain and inflammation.

## 2020-09-05 NOTE — ED Triage Notes (Signed)
Pt presents with penile pain. Denies any discharge or painful urination. States pain is worse with friction between skin and underwear.

## 2020-09-05 NOTE — ED Provider Notes (Signed)
[facility]   MRN: [id number] DOB: 1965-07-14  Subjective:   Vincent Wade is a 55 y.o. 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  Patient states it has previously been lanced but unfortunately returned.  Over the course of 4 years it has not bothered him until this past week.  Has felt pain with palpation or friction against the close.  He started using Vaseline with minimal relief.  Denies fever, nausea, vomiting, dysuria, hematuria.  No current facility-administered medications for this encounter.  Current Outpatient Medications:  .  HYDROcodone-acetaminophen (NORCO) 5-325 MG tablet, Take 1 tablet by mouth every 6 (six) hours as needed., Disp: 20 tablet, Rfl: 0 .  ibuprofen (ADVIL) 400 MG tablet, Take 400 mg by mouth every 6 (six) hours as needed., Disp: , Rfl:  .  ondansetron (ZOFRAN) 4 MG tablet, Take 1-2 tablets (4-8 mg total) by mouth every 8 (eight) hours as needed for nausea or vomiting., Disp: 20 tablet, Rfl: 0 .  traMADol (ULTRAM) 50 MG tablet, Take 1 tablet (50 mg total) by mouth every 12 (twelve) hours as needed., Disp: 20 tablet, Rfl: 0   No Known Allergies  Past Medical History:  Diagnosis Date  . GERD (gastroesophageal reflux disease)    OTC meds  . MMT (medial meniscus tear)    right knee     Past Surgical History:  Procedure Laterality Date  . KNEE ARTHROSCOPY WITH MENISCAL REPAIR Right 07/24/2020   Procedure: RIGHT KNEE ARTHROSCOPY WITH MEDIAL MENISCUS REPAIR;  Surgeon: Tarry Kos, MD;  Location: [facility];  Service: Orthopedics;  Laterality: Right;  . NO PAST SURGERIES      History reviewed. No pertinent family history.  Social History   Tobacco Use  . Smoking status: Never Smoker  . Smokeless tobacco: Never Used  Substance Use Topics  . Alcohol use: Never  . Drug use: Never    ROS   Objective:   Vitals: BP (!) 157/89 (BP Location: Right Arm)   Pulse 72   Temp 98.5 F (36.9 C)  (Oral)   Resp 18   SpO2 97%   Physical Exam Constitutional:      General: He is not in acute distress.    Appearance: Normal appearance. He is well-developed and normal weight. He is not ill-appearing, toxic-appearing or diaphoretic.  HENT:     Head: Normocephalic and atraumatic.     Right Ear: External ear normal.     Left Ear: External ear normal.     Nose: Nose normal.     Mouth/Throat:     Pharynx: Oropharynx is clear.  Eyes:     General: No scleral icterus.       Right eye: No discharge.        Left eye: No discharge.     Extraocular Movements: Extraocular movements intact.     Pupils: Pupils are equal, round, and reactive to light.  Cardiovascular:     Rate and Rhythm: Normal rate.  Pulmonary:     Effort: Pulmonary effort is normal.  Genitourinary:   Musculoskeletal:     Cervical back: Normal range of motion.  Neurological:     Mental Status: He is alert and oriented to person, place, and time.  Psychiatric:        Mood and Affect: Mood normal.        Behavior: Behavior normal.        Thought Content: Thought content normal.  Judgment: Judgment normal.     Assessment and Plan :   PDMP not reviewed this encounter.  1. Penile lesion     Case discussed with Dr. Leonides Grills, will refer to urology for further management and procedural removal. Recommended naproxen for pain control. Counseled patient on potential for adverse effects with medications prescribed/recommended today, ER and return-to-clinic precautions discussed, patient verbalized understanding.    Wallis Bamberg, PA-C 09/05/20 1119

## 2020-09-10 ENCOUNTER — Other Ambulatory Visit: Payer: Self-pay

## 2020-09-10 ENCOUNTER — Ambulatory Visit: Payer: 59 | Admitting: Physical Therapy

## 2020-09-10 DIAGNOSIS — M25661 Stiffness of right knee, not elsewhere classified: Secondary | ICD-10-CM

## 2020-09-10 DIAGNOSIS — R600 Localized edema: Secondary | ICD-10-CM

## 2020-09-10 DIAGNOSIS — M25561 Pain in right knee: Secondary | ICD-10-CM

## 2020-09-10 DIAGNOSIS — R262 Difficulty in walking, not elsewhere classified: Secondary | ICD-10-CM

## 2020-09-10 NOTE — Therapy (Signed)
[place]. [place], [place], [address] Phone: [phone]   Fax:  [phone]  Physical Therapy Treatment  Patient Details  Name: Vincent Wade MRN: [id number] Date of Birth: 11-Nov-1965 Referring Provider (PT): Purcell Mouton Date: 09/10/2020   PT End of Session - 09/10/20 1157    Visit Number 4    Number of Visits 20    Date for PT Re-Evaluation 10/21/20    PT Start Time 1100    PT Stop Time 1141    PT Time Calculation (min) 41 min    Activity Tolerance Patient tolerated treatment well    Behavior During Therapy [facility] for tasks assessed/performed;Anxious           Past Medical History:  Diagnosis Date  . GERD (gastroesophageal reflux disease)    OTC meds  . MMT (medial meniscus tear)    right knee    Past Surgical History:  Procedure Laterality Date  . KNEE ARTHROSCOPY WITH MENISCAL REPAIR Right 07/24/2020   Procedure: RIGHT KNEE ARTHROSCOPY WITH MEDIAL MENISCUS REPAIR;  Surgeon: Leandrew Koyanagi, MD;  Location: [place];  Service: Orthopedics;  Laterality: Right;  . NO PAST SURGERIES      There were no vitals filed for this visit.   Subjective Assessment - 09/10/20 1100    Subjective Pt arrived to clinic using STP with complaints of some pain in the posterior calf and feeling that the knee doesnt want to bend. When asked to rate pain on 0/10 scale said pain was 0.    Currently in Pain? No/denies                             [facility] Adult PT Treatment/Exercise - 09/10/20 0001      High Level Balance   High Level Balance Comments SLS on R on airex EO/EC + with ball toss      Knee/Hip Exercises: Stretches   Gastroc Stretch Right;2 reps;30 seconds      Knee/Hip Exercises: Aerobic   Recumbent Bike L2 x 6 mins      Knee/Hip Exercises: Machines for Strengthening   Total Gym Leg Press 40# 2x10      Knee/Hip Exercises: Standing   Terminal Knee Extension Right;2 sets;5  reps   Red Theraband   Lateral Step Up Step Height: 4";2 sets;10 reps    Forward Step Up Step Height: 4";2 sets;10 reps    Step Down Step Height: 4";2 sets;10 reps      Knee/Hip Exercises: Supine   Short Arc Quad Sets Right;1 set;10 reps   2#   Short Arc Quad Sets Limitations 2# SAQ with SLR 2x10                    PT Short Term Goals - 08/21/20 1049      PT SHORT TERM GOAL #1   Title indepednent with RICE    Time 2    Period Weeks    Status New             PT Long Term Goals - 09/04/20 1208      PT LONG TERM GOAL #1   Title independent with HEP    Status On-going      PT LONG TERM GOAL #2   Title report pain decrease 50%    Status Partially Met      PT LONG TERM GOAL #3  Title go up and down stairs without diffiuclty step over step    Status Partially Met      PT LONG TERM GOAL #4   Title increase AROM to WNL's    Status Partially Met      PT LONG TERM GOAL #5   Title ambulate without device and without deviation    Status On-going                 Plan - 09/10/20 1201    Clinical Impression Statement Pt tolerated treatment well, ROM and gait continues to improve. He c/o of some tightness in his posterior calf anf stiffness of his knee when he arrived but commented that it felt much better, less stiff, after the session. muscle fatigue noted with SLR during SAQ with quad quivering.    PT Treatment/Interventions ADLs/Self Care Home Management;Cryotherapy;Electrical Stimulation;Gait training;Neuromuscular re-education;Balance training;Therapeutic exercise;Therapeutic activities;Functional mobility training;Stair training;Patient/family education;Manual techniques;Vasopneumatic Device    PT Next Visit Plan continue with strength, ROM, and balance exercises           Patient will benefit from skilled therapeutic intervention in order to improve the following deficits and impairments:  Abnormal gait,Decreased range of motion,Difficulty  walking,Decreased activity tolerance,Pain,Decreased balance,Impaired flexibility,Increased edema,Decreased strength,Decreased mobility  Visit Diagnosis: Acute pain of right knee  Stiffness of right knee, not elsewhere classified  Localized edema  Difficulty in walking, not elsewhere classified     Problem List Patient Active Problem List   Diagnosis Date Noted  . Acute medial meniscus tear of right knee 07/09/2020    Ernst Spell, Dayle Points 09/10/2020, 12:27 PM  [place]. [place], [place], [address] Phone: [phone]   Fax:  [phone]  Name: Vincent Wade MRN: [id number] Date of Birth: 06/04/1965

## 2020-09-12 ENCOUNTER — Other Ambulatory Visit: Payer: Self-pay

## 2020-09-12 ENCOUNTER — Ambulatory Visit: Payer: 59 | Admitting: Physical Therapy

## 2020-09-12 DIAGNOSIS — M25661 Stiffness of right knee, not elsewhere classified: Secondary | ICD-10-CM

## 2020-09-12 DIAGNOSIS — M25561 Pain in right knee: Secondary | ICD-10-CM

## 2020-09-12 DIAGNOSIS — R262 Difficulty in walking, not elsewhere classified: Secondary | ICD-10-CM

## 2020-09-12 NOTE — Therapy (Signed)
[place]. [place], [place], [address] Phone: [phone]   Fax:  [phone]  Physical Therapy Treatment  Patient Details  Name: Vincent Wade MRN: [id number] Date of Birth: 03-Jul-1965 Referring Provider (PT): Purcell Mouton Date: 09/12/2020   PT End of Session - 09/12/20 1139    Visit Number 5    Number of Visits 20    Date for PT Re-Evaluation 10/21/20    PT Start Time 1140    PT Stop Time 1220    PT Time Calculation (min) 40 min    Activity Tolerance Patient tolerated treatment well    Behavior During Therapy [facility] for tasks assessed/performed           Past Medical History:  Diagnosis Date  . GERD (gastroesophageal reflux disease)    OTC meds  . MMT (medial meniscus tear)    right knee    Past Surgical History:  Procedure Laterality Date  . KNEE ARTHROSCOPY WITH MENISCAL REPAIR Right 07/24/2020   Procedure: RIGHT KNEE ARTHROSCOPY WITH MEDIAL MENISCUS REPAIR;  Surgeon: Leandrew Koyanagi, MD;  Location: [place];  Service: Orthopedics;  Laterality: Right;  . NO PAST SURGERIES      There were no vitals filed for this visit.   Subjective Assessment - 09/12/20 1143    Subjective pt arrived with continued use of STC. He said he was still using the cane some outside of the home but not as much. He had no c/o pain but did state he was having some stiffness.    Patient is accompained by: Interpreter    Currently in Pain? No/denies              [facility] PT Assessment - 09/12/20 0001      AROM   AROM Assessment Site Knee    Right/Left Knee Right    Right Knee Extension 6    Right Knee Flexion 105                         [facility] Adult PT Treatment/Exercise - 09/12/20 0001      High Level Balance   High Level Balance Activities Tandem walking;Side stepping   on airex beam   High Level Balance Comments DLS on bosu, SLS on R with ball kick on airex      Knee/Hip Exercises:  Aerobic   Recumbent Bike L2 x 4 mins    Nustep level 4 x 6 minutes      Knee/Hip Exercises: Machines for Strengthening   Total Gym Leg Press 40# 1x15, SL 20#  both 10x      Knee/Hip Exercises: Standing   Heel Raises Both;2 sets;15 reps    Terminal Knee Extension Right;2 sets;10 reps    Terminal Knee Extension Limitations ball behind the knee      Knee/Hip Exercises: Seated   Long Arc Quad Both;2 sets;10 reps    Long Arc Quad Weight 2 lbs.    Hamstring Curl Both;2 sets;10 reps    Hamstring Limitations Red TBand      Knee/Hip Exercises: Supine   Straight Leg Raises Both;2 sets;10 reps   last set with 1#, quad set cueing for both sets                   PT Short Term Goals - 08/21/20 1049      PT SHORT TERM GOAL #1   Title indepednent with RICE  Time 2    Period Weeks    Status New             PT Long Term Goals - 09/12/20 1235      PT LONG TERM GOAL #1   Title independent with HEP    Status On-going      PT LONG TERM GOAL #2   Title report pain decrease 50%    Status Partially Met      PT LONG TERM GOAL #3   Title go up and down stairs without diffiuclty step over step    Status Partially Met      PT LONG TERM GOAL #4   Title increase AROM to WNL's    Baseline AROM R knee 6-105 flexion    Status Partially Met      PT LONG TERM GOAL #5   Title ambulate without device and without deviation    Status On-going                 Plan - 09/12/20 1232    Clinical Impression Statement Pt tolerated treatment well, ROM and strength continue to improve. He tolerated advancement to LAQ well with 2# weight. cueing still reqired for TKE during all TE.    PT Treatment/Interventions ADLs/Self Care Home Management;Cryotherapy;Electrical Stimulation;Gait training;Neuromuscular re-education;Balance training;Therapeutic exercise;Therapeutic activities;Functional mobility training;Stair training;Patient/family education;Manual techniques;Vasopneumatic Device     PT Next Visit Plan continue LE strength, ROM, and balance exercises.           Patient will benefit from skilled therapeutic intervention in order to improve the following deficits and impairments:  Abnormal gait,Decreased range of motion,Difficulty walking,Decreased activity tolerance,Pain,Decreased balance,Impaired flexibility,Increased edema,Decreased strength,Decreased mobility  Visit Diagnosis: Acute pain of right knee  Stiffness of right knee, not elsewhere classified  Difficulty in walking, not elsewhere classified     Problem List Patient Active Problem List   Diagnosis Date Noted  . Acute medial meniscus tear of right knee 07/09/2020    Ernst Spell, Dayle Points 09/12/2020, 12:41 PM  [place]. [place], [place], [address] Phone: [phone]   Fax:  [phone]  Name: Vincent Wade MRN: [id number] Date of Birth: 09-05-1965

## 2020-09-17 ENCOUNTER — Ambulatory Visit: Payer: 59 | Admitting: Physical Therapy

## 2020-09-17 ENCOUNTER — Encounter: Payer: Self-pay | Admitting: Physical Therapy

## 2020-09-17 ENCOUNTER — Other Ambulatory Visit: Payer: Self-pay

## 2020-09-17 DIAGNOSIS — M25561 Pain in right knee: Secondary | ICD-10-CM

## 2020-09-17 DIAGNOSIS — R600 Localized edema: Secondary | ICD-10-CM

## 2020-09-17 DIAGNOSIS — R262 Difficulty in walking, not elsewhere classified: Secondary | ICD-10-CM

## 2020-09-17 DIAGNOSIS — M25661 Stiffness of right knee, not elsewhere classified: Secondary | ICD-10-CM

## 2020-09-17 NOTE — Therapy (Signed)
[facility] [address] [place], [place], [address] Phone: [phone]   Fax:  [phone]  Physical Therapy Treatment  Patient Details  Name: Omri Bertran MRN: [id number] Date of Birth: 11-05-1965 Referring Provider (PT): Haynes Kerns Date: 09/17/2020   PT End of Session - 09/17/20 1101    Visit Number 6    Number of Visits 20    Date for PT Re-Evaluation 10/21/20    PT Start Time 1010    PT Stop Time 1055    PT Time Calculation (min) 45 min    Activity Tolerance Patient tolerated treatment well    Behavior During Therapy [facility] for tasks assessed/performed           Past Medical History:  Diagnosis Date  . GERD (gastroesophageal reflux disease)    OTC meds  . MMT (medial meniscus tear)    right knee    Past Surgical History:  Procedure Laterality Date  . KNEE ARTHROSCOPY WITH MENISCAL REPAIR Right 07/24/2020   Procedure: RIGHT KNEE ARTHROSCOPY WITH MEDIAL MENISCUS REPAIR;  Surgeon: Tarry Kos, MD;  Location: [facility];  Service: Orthopedics;  Laterality: Right;  . NO PAST SURGERIES      There were no vitals filed for this visit.   Subjective Assessment - 09/17/20 1013    Subjective Patient reports that he is doing better just still timid and careful, a little soreness in the front of the knee    Currently in Pain? No/denies                             [facility] Adult PT Treatment/Exercise - 09/17/20 0001      Ambulation/Gait   Gait Comments 4" and 6" steps step over step with some focus on eccentrics on the 6" side      High Level Balance   High Level Balance Comments bosu standing, reaching, head turns      Knee/Hip Exercises: Aerobic   Recumbent Bike L3 x 6 mins    Nustep level 5 x 6 minutes      Knee/Hip Exercises: Machines for Strengthening   Total Gym Leg Press 40# 1x15, SL 20#  both 10x      Knee/Hip Exercises: Standing   Hip Flexion Right;2 sets;10 reps     Hip Flexion Limitations 5#    Hip Abduction Right;2 sets;10 reps    Abduction Limitations 5#    Hip Extension Right;2 sets;10 reps    Extension Limitations 5#    Walking with Sports Cord 50# resisted gait all directions    Other Standing Knee Exercises 10# farmer carry unilateral each hand, then did 20#      Knee/Hip Exercises: Seated   Other Seated Knee/Hip Exercises fitter extension with one black band                    PT Short Term Goals - 08/21/20 1049      PT SHORT TERM GOAL #1   Title indepednent with RICE    Time 2    Period Weeks    Status New             PT Long Term Goals - 09/17/20 1105      PT LONG TERM GOAL #2   Title report pain decrease 50%    Status Partially Met      PT LONG TERM GOAL #3  Title go up and down stairs without diffiuclty step over step    Status Partially Met                 Plan - 09/17/20 1102    Clinical Impression Statement Patient is at about 8 weeks S/P meniscus repair, MD note to me was to limit squatting past 60 degrees for 3 months, I have explained to the patient about the protocol and he is asking about return to work, I told him he would need to talk with MD about this, and 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  He is getting stronger and moving better , less pain some little bit of pain and tightness    PT Next Visit Plan continue LE strength, ROM, and balance exercises.    Consulted and Agree with Plan of Care Patient           Patient will benefit from skilled therapeutic intervention in order to improve the following deficits and impairments:  Abnormal gait,Decreased range of motion,Difficulty walking,Decreased activity tolerance,Pain,Decreased balance,Impaired flexibility,Increased edema,Decreased strength,Decreased mobility  Visit Diagnosis: Acute pain of right knee  Stiffness of right knee, not  elsewhere classified  Difficulty in walking, not elsewhere classified  Localized edema     Problem List Patient Active Problem List   Diagnosis Date Noted  . Acute medial meniscus tear of right knee 07/09/2020    Sumner Boast., PT 09/17/2020, 11:45 AM  [place]. [place], [place], [address] Phone: [phone]   Fax:  [phone]  Name: Devesh Monforte MRN: [id number] Date of Birth: 1966/01/12

## 2020-09-19 ENCOUNTER — Other Ambulatory Visit: Payer: Self-pay

## 2020-09-19 ENCOUNTER — Ambulatory Visit: Payer: 59 | Admitting: Physical Therapy

## 2020-09-19 DIAGNOSIS — M25661 Stiffness of right knee, not elsewhere classified: Secondary | ICD-10-CM

## 2020-09-19 DIAGNOSIS — M25561 Pain in right knee: Secondary | ICD-10-CM | POA: Diagnosis not present

## 2020-09-19 DIAGNOSIS — R262 Difficulty in walking, not elsewhere classified: Secondary | ICD-10-CM

## 2020-09-19 NOTE — Therapy (Signed)
[place]. [place], [place], [address] Phone: [phone]   Fax:  [phone]  Physical Therapy Treatment  Patient Details  Name: Vincent Wade MRN: [id number] Date of Birth: 03-May-1966 Referring Provider (PT): Erlinda Hong   Encounter Date: 09/19/2020   PT End of Session - 09/19/20 1140    Visit Number 7    Number of Visits 20    Date for PT Re-Evaluation 10/21/20    PT Start Time 1100    PT Stop Time 1140    PT Time Calculation (min) 40 min    Activity Tolerance Patient tolerated treatment well    Behavior During Therapy [facility] for tasks assessed/performed           Past Medical History:  Diagnosis Date  . GERD (gastroesophageal reflux disease)    OTC meds  . MMT (medial meniscus tear)    right knee    Past Surgical History:  Procedure Laterality Date  . KNEE ARTHROSCOPY WITH MENISCAL REPAIR Right 07/24/2020   Procedure: RIGHT KNEE ARTHROSCOPY WITH MEDIAL MENISCUS REPAIR;  Surgeon: Leandrew Koyanagi, MD;  Location: [facility];  Service: Orthopedics;  Laterality: Right;  . NO PAST SURGERIES      There were no vitals filed for this visit.   Subjective Assessment - 09/19/20 1101    Subjective Doing okay, state he woke up having a little pain but it was feeling better now.    Currently in Pain? No/denies                             [facility] Adult PT Treatment/Exercise - 09/19/20 0001      High Level Balance   High Level Balance Comments SLS with head turns on airex on R      Knee/Hip Exercises: Aerobic   Elliptical L1 x 6 mins      Knee/Hip Exercises: Machines for Strengthening   Total Gym Leg Press 50# 1x15 SL BIL 2x10 20#      Knee/Hip Exercises: Standing   Hip Abduction Both;1 set;10 reps    Abduction Limitations 5#    Hip Extension Both;1 set;10 reps    Extension Limitations 5#    Step Down 2 sets;10 reps;Step Height: 6"   HH x 2   Wall Squat 2 sets;10 reps   10 with 20#  KB   Lunge Walking - Round Trips x8 ea leg  x2    Walking with Sports Cord 50# resisted gait all directions x5    Other Standing Knee Exercises 20# farmer carry x2 100'BIL                    PT Short Term Goals - 08/21/20 1049      PT SHORT TERM GOAL #1   Title indepednent with RICE    Time 2    Period Weeks    Status New             PT Long Term Goals - 09/17/20 1105      PT LONG TERM GOAL #2   Title report pain decrease 50%    Status Partially Met      PT LONG TERM GOAL #3   Title go up and down stairs without diffiuclty step over step    Status Partially Met                 Plan - 09/19/20 1141  Clinical Impression Statement Patient tolerated treatment well today with advancement of strength exercises, incuding lunges and wall squats within the 60 degree range. Fatigue noted with eccentric step downs with posterior postural sway and HHx2. Cueing needed during standing TE for proper posture and form. Some LOB noted during SLS on airex.    PT Treatment/Interventions ADLs/Self Care Home Management;Cryotherapy;Electrical Stimulation;Gait training;Neuromuscular re-education;Balance training;Therapeutic exercise;Therapeutic activities;Functional mobility training;Stair training;Patient/family education;Manual techniques;Vasopneumatic Device    PT Next Visit Plan continue LE strength, ROM, and balance exercises.           Patient will benefit from skilled therapeutic intervention in order to improve the following deficits and impairments:  Abnormal gait,Decreased range of motion,Difficulty walking,Decreased activity tolerance,Pain,Decreased balance,Impaired flexibility,Increased edema,Decreased strength,Decreased mobility  Visit Diagnosis: No diagnosis found.     Problem List Patient Active Problem List   Diagnosis Date Noted  . Acute medial meniscus tear of right knee 07/09/2020    Ernst Spell, Dayle Points 09/19/2020, 11:44 AM  [place]. [place], [place], [address] Phone: [phone]   Fax:  [phone]  Name: Vincent Wade MRN: [id number] Date of Birth: June 25, 1965

## 2020-09-24 ENCOUNTER — Ambulatory Visit: Payer: 59

## 2020-09-24 ENCOUNTER — Other Ambulatory Visit: Payer: Self-pay

## 2020-09-24 DIAGNOSIS — M25561 Pain in right knee: Secondary | ICD-10-CM

## 2020-09-24 DIAGNOSIS — R600 Localized edema: Secondary | ICD-10-CM

## 2020-09-24 DIAGNOSIS — M25661 Stiffness of right knee, not elsewhere classified: Secondary | ICD-10-CM

## 2020-09-24 DIAGNOSIS — R262 Difficulty in walking, not elsewhere classified: Secondary | ICD-10-CM

## 2020-09-24 NOTE — Therapy (Signed)
[facility] [address] [place], [place], [address] Phone: [phone]   Fax:  [phone]  Physical Therapy Treatment  Patient Details  Name: Vincent Wade MRN: [id number] Date of Birth: 1965/08/22 Referring Provider (PT): Haynes Kerns Date: 09/24/2020   PT End of Session - 09/24/20 0801    Visit Number 8    Number of Visits 20    Date for PT Re-Evaluation 10/21/20    PT Start Time 0758    PT Stop Time 0840    PT Time Calculation (min) 42 min    Activity Tolerance Patient tolerated treatment well    Behavior During Therapy [facility] for tasks assessed/performed           Past Medical History:  Diagnosis Date  . GERD (gastroesophageal reflux disease)    OTC meds  . MMT (medial meniscus tear)    right knee    Past Surgical History:  Procedure Laterality Date  . KNEE ARTHROSCOPY WITH MENISCAL REPAIR Right 07/24/2020   Procedure: RIGHT KNEE ARTHROSCOPY WITH MEDIAL MENISCUS REPAIR;  Surgeon: Tarry Kos, MD;  Location: [facility];  Service: Orthopedics;  Laterality: Right;  . NO PAST SURGERIES      There were no vitals filed for this visit.   Subjective Assessment - 09/24/20 0800    Subjective Doing okay, state he woke up having a little tension but not anymore, thinks it was the position he slept in    Patient Stated Goals have less pain, better motion and walk without difficulty    Currently in Pain? No/denies               [facility] Adult PT Treatment/Exercise - 09/24/20 0001      High Level Balance   High Level Balance Activities Side stepping;Tandem walking    High Level Balance Comments alt taps from airex to 8" step      Knee/Hip Exercises: Aerobic   Elliptical L1.5 x 4 min - 2 min fwd/2 min bwd    Recumbent Bike L2 x 6 min      Knee/Hip Exercises: Machines for Strengthening   Total Gym Leg Press 50# 1x15 BIL,  2x10 20# SL both      Knee/Hip Exercises: Standing   Hip Flexion Right;2  sets;10 reps    Hip Flexion Limitations 5#    Hip Abduction Both;10 reps;2 sets    Abduction Limitations 5#    Hip Extension Both;10 reps;2 sets    Extension Limitations 5#    Lateral Step Up 2 sets;10 reps;Step Height: 4"   1 set each leg lead   Forward Step Up 2 sets;10 reps;Step Height: 6"   1 set each leg lead   Step Down 2 sets;Step Height: 6";5 sets;Step Height: 4"   1 set 4", 1 set 6"   Walking with Sports Cord 50# resisted gait all directions x5                    PT Short Term Goals - 09/24/20 0804      PT SHORT TERM GOAL #1   Title indepednent with RICE    Time 2    Period Weeks    Status Achieved             PT Long Term Goals - 09/17/20 1105      PT LONG TERM GOAL #2   Title report pain decrease 50%    Status Partially Met  PT LONG TERM GOAL #3   Title go up and down stairs without diffiuclty step over step    Status Partially Met                 Plan - 09/24/20 0801    Clinical Impression Statement Patient tolerated treatment well today with advancement of strength exercises, Continues to demo alot of shakiness and quick mms fatigue with eccentric step downs with posterior postural sway and HHx2. Some cueing needed with strength exercies for form - comensations noted with sidestepping, would turn trunk. Reported feeling fatigued as well with exercises.    PT Treatment/Interventions ADLs/Self Care Home Management;Cryotherapy;Electrical Stimulation;Gait training;Neuromuscular re-education;Balance training;Therapeutic exercise;Therapeutic activities;Functional mobility training;Stair training;Patient/family education;Manual techniques;Vasopneumatic Device    PT Next Visit Plan continue LE strength, ROM, and balance exercises.    Consulted and Agree with Plan of Care Patient           Patient will benefit from skilled therapeutic intervention in order to improve the following deficits and impairments:  Abnormal gait,Decreased range of  motion,Difficulty walking,Decreased activity tolerance,Pain,Decreased balance,Impaired flexibility,Increased edema,Decreased strength,Decreased mobility  Visit Diagnosis: Acute pain of right knee  Stiffness of right knee, not elsewhere classified  Difficulty in walking, not elsewhere classified  Localized edema     Problem List Patient Active Problem List   Diagnosis Date Noted  . Acute medial meniscus tear of right knee 07/09/2020    Hall Busing, PT, DPT 09/24/2020, 8:45 AM  [place]. [place], [place], [address] Phone: [phone]   Fax:  [phone]  Name: Vincent Wade MRN: [id number] Date of Birth: 1966/03/29

## 2020-10-01 ENCOUNTER — Ambulatory Visit: Payer: 59 | Admitting: Physical Therapy

## 2020-10-02 ENCOUNTER — Other Ambulatory Visit: Payer: Self-pay

## 2020-10-02 ENCOUNTER — Ambulatory Visit: Payer: 59 | Attending: Orthopaedic Surgery | Admitting: Physical Therapy

## 2020-10-02 DIAGNOSIS — R262 Difficulty in walking, not elsewhere classified: Secondary | ICD-10-CM | POA: Insufficient documentation

## 2020-10-02 DIAGNOSIS — R600 Localized edema: Secondary | ICD-10-CM | POA: Diagnosis present

## 2020-10-02 DIAGNOSIS — M25561 Pain in right knee: Secondary | ICD-10-CM | POA: Diagnosis present

## 2020-10-02 DIAGNOSIS — M25661 Stiffness of right knee, not elsewhere classified: Secondary | ICD-10-CM | POA: Diagnosis not present

## 2020-10-02 NOTE — Therapy (Signed)
[place] [place], [place], [address] Phone: [phone]   Fax:  [phone]  Physical Therapy Treatment  Patient Details  Name: Vincent Wade MRN: [id number] Date of Birth: 07-31-1965 Referring Provider (PT): Purcell Mouton Date: 10/02/2020   PT End of Session - 10/02/20 0759    Visit Number 9    Number of Visits 20    Date for PT Re-Evaluation 10/21/20    PT Start Time 0755    PT Stop Time 0837    PT Time Calculation (min) 42 min    Activity Tolerance Patient tolerated treatment well    Behavior During Therapy [facility] for tasks assessed/performed           Past Medical History:  Diagnosis Date  . GERD (gastroesophageal reflux disease)    OTC meds  . MMT (medial meniscus tear)    right knee    Past Surgical History:  Procedure Laterality Date  . KNEE ARTHROSCOPY WITH MENISCAL REPAIR Right 07/24/2020   Procedure: RIGHT KNEE ARTHROSCOPY WITH MEDIAL MENISCUS REPAIR;  Surgeon: Leandrew Koyanagi, MD;  Location: [place];  Service: Orthopedics;  Laterality: Right;  . NO PAST SURGERIES      There were no vitals filed for this visit.   Subjective Assessment - 10/02/20 0758    Subjective Doing good, stated that he is not having any pain but knee feels tighter today.    Patient is accompained by: Interpreter    Currently in Pain? No/denies                             [facility] Adult PT Treatment/Exercise - 10/02/20 0001      High Level Balance   High Level Balance Comments step up on RLE with 5sec hold on bosu ball x10      Knee/Hip Exercises: Aerobic   Elliptical L2 55fd/2bck    Recumbent Bike L2 x 655ms      Knee/Hip Exercises: Machines for Strengthening   Total Gym Leg Press 50# 1x15 BIL,  2x10 20# SL both      Knee/Hip Exercises: Standing   Hip Abduction Both;2 sets;10 reps    Abduction Limitations 5#    Hip Extension Both;2 sets;10 reps    Extension Limitations 5#     Step Down Right;2 sets;10 reps;Hand Hold: 2;Step Height: 6"    Lunge Walking - Round Trips 8# fwd x 30' reverse x15'    Walking with Sports Cord 50# resisted gait all directions x5    Other Standing Knee Exercises 20# farmers carry x50' both hands    Other Standing Knee Exercises 5# step taps 6" to 12" step x10 each leg                    PT Short Term Goals - 09/24/20 0804      PT SHORT TERM GOAL #1   Title indepednent with RICE    Time 2    Period Weeks    Status Achieved             PT Long Term Goals - 10/02/20 0759      PT LONG TERM GOAL #1   Title independent with HEP    Status On-going      PT LONG TERM GOAL #2   Title report pain decrease 50%    Status Partially Met      PT  LONG TERM GOAL #3   Title go up and down stairs without diffiuclty step over step    Status Partially Met                 Plan - 10/02/20 0837    Clinical Impression Statement Pt tolerated treatment well today with progression of strength ex. Fatigue noted with continued shaking of R quad with eccentric step downs. Postural sway noted during lunges, decreased tolerance for backward lunges with difficulty returning from lunge to startng point. Cueing needed to maintain proper for with resisted walking sideways and lunges.    PT Treatment/Interventions ADLs/Self Care Home Management;Cryotherapy;Electrical Stimulation;Gait training;Neuromuscular re-education;Balance training;Therapeutic exercise;Therapeutic activities;Functional mobility training;Stair training;Patient/family education;Manual techniques;Vasopneumatic Device    PT Next Visit Plan continue functional LE strength, ROM, and balance exercises.           Patient will benefit from skilled therapeutic intervention in order to improve the following deficits and impairments:  Abnormal gait,Decreased range of motion,Difficulty walking,Decreased activity tolerance,Pain,Decreased balance,Impaired flexibility,Increased  edema,Decreased strength,Decreased mobility  Visit Diagnosis: Stiffness of right knee, not elsewhere classified  Acute pain of right knee  Difficulty in walking, not elsewhere classified  Localized edema     Problem List Patient Active Problem List   Diagnosis Date Noted  . Acute medial meniscus tear of right knee 07/09/2020    Ernst Spell, SPTA 10/02/2020, 8:40 AM  [place] [place], [place], [address] Phone: [phone]   Fax:  [phone]  Name: Vincent Wade MRN: [id number] Date of Birth: 20-May-1966

## 2020-10-10 ENCOUNTER — Ambulatory Visit: Payer: 59 | Admitting: Physical Therapy

## 2020-10-10 ENCOUNTER — Other Ambulatory Visit: Payer: Self-pay

## 2020-10-10 DIAGNOSIS — M25661 Stiffness of right knee, not elsewhere classified: Secondary | ICD-10-CM

## 2020-10-10 NOTE — Therapy (Signed)
[place]. [place], [place], [address] Phone: [phone]   Fax:  [phone]  Physical Therapy Treatment  Patient Details  Name: Vincent Wade MRN: [id number] Date of Birth: [birth year] Referring Provider (PT): Purcell Mouton Date: 10/10/2020   PT End of Session - 10/10/20 1527    Visit Number 10    Date for PT Re-Evaluation 10/21/20    PT Start Time 9628    PT Stop Time 3662    PT Time Calculation (min) 45 min           Past Medical History:  Diagnosis Date  . GERD (gastroesophageal reflux disease)    OTC meds  . MMT (medial meniscus tear)    right knee    Past Surgical History:  Procedure Laterality Date  . KNEE ARTHROSCOPY WITH MENISCAL REPAIR Right 07/24/2020   Procedure: RIGHT KNEE ARTHROSCOPY WITH MEDIAL MENISCUS REPAIR;  Surgeon: Leandrew Koyanagi, MD;  Location: [place];  Service: Orthopedics;  Laterality: Right;  . NO PAST SURGERIES      There were no vitals filed for this visit.   Subjective Assessment - 10/10/20 1449    Subjective " i think maybe twisted knee while I slept last night, alitlle sore but good now" return to MD 5/17    Currently in Pain? No/denies                             [facility] Adult PT Treatment/Exercise - 10/10/20 0001      Ambulation/Gait   Gait Comments hild fwd,back and SW. ladder, reciprocal flight of steps no rail      Knee/Hip Exercises: Aerobic   Nustep L 6 6 min LE only      Knee/Hip Exercises: Machines for Strengthening   Cybex Knee Extension 15# BIL up ,ecc left down 10x. 10# LLE only 10x    Cybex Knee Flexion 25# RT LE only 2 sets 10      Knee/Hip Exercises: Standing   Walking with Sports Cord running man fwd and laterally 20 x 2 x fwd adn 2 xSW    Other Standing Knee Exercises 6 in step up /over/rev RT 2 sets 10    Other Standing Knee Exercises BOSU squat ,black up , 2 sets 10 UE support   fwd lunge on sit fit 10 each,  side squat 10 each LE                   PT Short Term Goals - 09/24/20 0804      PT SHORT TERM GOAL #1   Title indepednent with RICE    Time 2    Period Weeks    Status Achieved             PT Long Term Goals - 10/10/20 1518      PT LONG TERM GOAL #1   Title independent with HEP    Status Partially Met      PT LONG TERM GOAL #2   Title report pain decrease 50%    Baseline almost 100% but nights are painful and twisting    Status Partially Met      PT LONG TERM GOAL #3   Title go up and down stairs without diffiuclty step over step    Baseline some pain with eccentric step dwon    Status Partially Met      PT LONG  TERM GOAL #4   Title increase AROM to WNL's    Baseline AROM R knee 0-116 flexion    Status Partially Met      PT LONG TERM GOAL #5   Title ambulate without device and without deviation    Status Achieved                 Plan - 10/10/20 1528    Clinical Impression Statement tightness in knee with actvity and explained this is d/t swelling and scar from repair. improved ROM but some tightness with knee flexion doing ladder, up and down steps step over step but some paina nd weakness eccentrically. progressing well and sees MD next week    PT Treatment/Interventions ADLs/Self Care Home Management;Cryotherapy;Electrical Stimulation;Gait training;Neuromuscular re-education;Balance training;Therapeutic exercise;Therapeutic activities;Functional mobility training;Stair training;Patient/family education;Manual techniques;Vasopneumatic Device    PT Next Visit Plan continue functional LE strength, ROM, and balance exercises.           Patient will benefit from skilled therapeutic intervention in order to improve the following deficits and impairments:  Abnormal gait,Decreased range of motion,Difficulty walking,Decreased activity tolerance,Pain,Decreased balance,Impaired flexibility,Increased edema,Decreased strength,Decreased mobility  Visit  Diagnosis: Stiffness of right knee, not elsewhere classified     Problem List Patient Active Problem List   Diagnosis Date Noted  . Acute medial meniscus tear of right knee 07/09/2020    Dammon Makarewicz,ANGIE PTA 10/10/2020, 3:29 PM  [place]. [place], [place], [address] Phone: [phone]   Fax:  [phone]  Name: Vincent Wade MRN: [id number] Date of Birth: Dec 20, 1965

## 2020-10-11 ENCOUNTER — Ambulatory Visit: Payer: 59

## 2020-10-11 DIAGNOSIS — M25561 Pain in right knee: Secondary | ICD-10-CM

## 2020-10-11 DIAGNOSIS — R262 Difficulty in walking, not elsewhere classified: Secondary | ICD-10-CM

## 2020-10-11 DIAGNOSIS — R600 Localized edema: Secondary | ICD-10-CM

## 2020-10-11 DIAGNOSIS — M25661 Stiffness of right knee, not elsewhere classified: Secondary | ICD-10-CM | POA: Diagnosis not present

## 2020-10-11 NOTE — Therapy (Signed)
[name], [place], [address] Phone: [phone]   Fax:  [phone]  Physical Therapy Treatment  Patient Details  Name: Vincent Wade MRN: [id number] Date of Birth: Apr 02, 1966 Referring Provider (PT): Purcell Mouton Date: 10/11/2020   PT End of Session - 10/11/20 1105    Visit Number 11    Date for PT Re-Evaluation 10/21/20    PT Start Time 1102    PT Stop Time 1140    PT Time Calculation (min) 38 min    Activity Tolerance Patient tolerated treatment well    Behavior During Therapy [facility] for tasks assessed/performed           Past Medical History:  Diagnosis Date  . GERD (gastroesophageal reflux disease)    OTC meds  . MMT (medial meniscus tear)    right knee    Past Surgical History:  Procedure Laterality Date  . KNEE ARTHROSCOPY WITH MENISCAL REPAIR Right 07/24/2020   Procedure: RIGHT KNEE ARTHROSCOPY WITH MEDIAL MENISCUS REPAIR;  Surgeon: Leandrew Koyanagi, MD;  Location: [place];  Service: Orthopedics;  Laterality: Right;  . NO PAST SURGERIES      There were no vitals filed for this visit.   Subjective Assessment - 10/11/20 1105    Subjective "Just tired today"    Patient Stated Goals have less pain, better motion and walk without difficulty    Currently in Pain? No/denies                             [facility] Adult PT Treatment/Exercise - 10/11/20 0001      Knee/Hip Exercises: Aerobic   Nustep L 6 6 min LE only      Knee/Hip Exercises: Machines for Strengthening   Cybex Knee Extension 15# BIL up ,ecc left down 10x. 10# LLE only 10x    Cybex Knee Flexion 25# RT LE only 2 sets 10    Total Gym Leg Press 60# 1x15 BIL,  1x15  20# SL both      Knee/Hip Exercises: Standing   Lunge Walking - Round Trips 8# fwd and reverse x 2 laps (2nd lap reverse without weight)    Walking with Sports Cord running man fwd and laterally 20 x 2 . lateral x4 sidestepping each  way with last rep SW running man x5    Other Standing Knee Exercises BOSU squat , 2 sets 10 with CS - UE hovering near UE support                    PT Short Term Goals - 09/24/20 0804      PT SHORT TERM GOAL #1   Title indepednent with RICE    Time 2    Period Weeks    Status Achieved             PT Long Term Goals - 10/10/20 1518      PT LONG TERM GOAL #1   Title independent with HEP    Status Partially Met      PT LONG TERM GOAL #2   Title report pain decrease 50%    Baseline almost 100% but nights are painful and twisting    Status Partially Met      PT LONG TERM GOAL #3   Title go up and down stairs without diffiuclty step over step    Baseline some pain with eccentric  step dwon    Status Partially Met      PT LONG TERM GOAL #4   Title increase AROM to WNL's    Baseline AROM R knee 0-116 flexion    Status Partially Met      PT LONG TERM GOAL #5   Title ambulate without device and without deviation    Status Achieved                 Plan - 10/11/20 1106    Clinical Impression Statement Reported just a bit tired from yesterdays PT session but doing well. Able to tolerate all exercises without pain just fatigued. Cues provided for alignment and foot placement with lunges and BOSU squats, most difficulty with reverse lunged. He is seeing MD next week so plan is to complete progress update at his next PT visit.    PT Treatment/Interventions ADLs/Self Care Home Management;Cryotherapy;Electrical Stimulation;Gait training;Neuromuscular re-education;Balance training;Therapeutic exercise;Therapeutic activities;Functional mobility training;Stair training;Patient/family education;Manual techniques;Vasopneumatic Device    PT Next Visit Plan continue functional LE strength, ROM, and balance exercises. Update next visit before MD visit    Consulted and Agree with Plan of Care Patient           Patient will benefit from skilled therapeutic intervention in  order to improve the following deficits and impairments:  Abnormal gait,Decreased range of motion,Difficulty walking,Decreased activity tolerance,Pain,Decreased balance,Impaired flexibility,Increased edema,Decreased strength,Decreased mobility  Visit Diagnosis: Stiffness of right knee, not elsewhere classified  Acute pain of right knee  Difficulty in walking, not elsewhere classified  Localized edema     Problem List Patient Active Problem List   Diagnosis Date Noted  . Acute medial meniscus tear of right knee 07/09/2020    Hall Busing, PT, DPT 10/11/2020, 11:42 AM  [place]. [place], [place], [address] Phone: [phone]   Fax:  [phone]  Name: Vincent Wade MRN: [id number] Date of Birth: [birth year]

## 2020-10-15 ENCOUNTER — Encounter: Payer: Self-pay | Admitting: Orthopaedic Surgery

## 2020-10-15 ENCOUNTER — Other Ambulatory Visit: Payer: Self-pay

## 2020-10-15 ENCOUNTER — Ambulatory Visit: Payer: 59 | Admitting: Physical Therapy

## 2020-10-15 ENCOUNTER — Ambulatory Visit (INDEPENDENT_AMBULATORY_CARE_PROVIDER_SITE_OTHER): Payer: 59 | Admitting: Physician Assistant

## 2020-10-15 DIAGNOSIS — M25661 Stiffness of right knee, not elsewhere classified: Secondary | ICD-10-CM | POA: Diagnosis not present

## 2020-10-15 DIAGNOSIS — Z9889 Other specified postprocedural states: Secondary | ICD-10-CM

## 2020-10-15 DIAGNOSIS — R262 Difficulty in walking, not elsewhere classified: Secondary | ICD-10-CM

## 2020-10-15 MED ORDER — TRAMADOL HCL 50 MG PO TABS: 50.0000 mg | ORAL_TABLET | Freq: Two times a day (BID) | ORAL | 2 refills | Status: DC | PRN

## 2020-10-15 NOTE — Therapy (Signed)
[place]. [place], [place], [address] Phone: [phone]   Fax:  [phone]  Physical Therapy Treatment  Patient Details  Name: Vincent Wade MRN: [id number] Date of Birth: July 25, 1965 Referring Provider (PT): Purcell Mouton Date: 10/15/2020   PT End of Session - 10/15/20 1031    Visit Number 12    Date for PT Re-Evaluation 10/21/20    PT Start Time 1000    PT Stop Time 1042    PT Time Calculation (min) 42 min           Past Medical History:  Diagnosis Date  . GERD (gastroesophageal reflux disease)    OTC meds  . MMT (medial meniscus tear)    right knee    Past Surgical History:  Procedure Laterality Date  . KNEE ARTHROSCOPY WITH MENISCAL REPAIR Right 07/24/2020   Procedure: RIGHT KNEE ARTHROSCOPY WITH MEDIAL MENISCUS REPAIR;  Surgeon: Leandrew Koyanagi, MD;  Location: [place];  Service: Orthopedics;  Laterality: Right;  . NO PAST SURGERIES      There were no vitals filed for this visit.   Subjective Assessment - 10/15/20 1002    Subjective still just slight pain medial knee in the morning. overall 90% better    Currently in Pain? Yes    Pain Score 1     Pain Location Knee              [facility] PT Assessment - 10/15/20 0001      AROM   AROM Assessment Site Knee    Right/Left Knee Right    Right Knee Extension 0    Right Knee Flexion 120      Strength   Strength Assessment Site Knee    Right/Left Knee Right    Right Knee Flexion 5/5    Right Knee Extension 4+/5   fucn fatigue                        [facility] Adult PT Treatment/Exercise - 10/15/20 0001      Knee/Hip Exercises: Aerobic   Elliptical L 3 3 fwd/3 back      Knee/Hip Exercises: Machines for Strengthening   Cybex Knee Extension 15# BIL up ,ecc left down 10x. 10# LLE only 10x    Cybex Knee Flexion 25# RT LE only 2 sets 10    Total Gym Leg Press 40# plyo 20 x 2 sets      Knee/Hip Exercises:  Standing   Lateral Step Up Right;15 reps   BOSU opp leg abd   Forward Step Up Right;15 reps   BOSU, driving Left leg up   Functional Squat 15 reps   on BOSU flat top up   SLS with Vectors on airex    Walking with Sports Cord 40# fwd with step up 5 x RT up, 5 x left up. 5 x each side                    PT Short Term Goals - 09/24/20 0804      PT SHORT TERM GOAL #1   Title indepednent with RICE    Time 2    Period Weeks    Status Achieved             PT Long Term Goals - 10/15/20 1007      PT LONG TERM GOAL #1   Title independent with HEP  Status Partially Met      PT LONG TERM GOAL #2   Title report pain decrease 50%    Status Achieved      PT LONG TERM GOAL #3   Title go up and down stairs without diffiuclty step over step    Status Achieved      PT LONG TERM GOAL #4   Title increase AROM to WNL's    Status Achieved      PT LONG TERM GOAL #5   Title ambulate without device and without deviation    Status Achieved                 Plan - 10/15/20 1024    Clinical Impression Statement MD note sent with pt for 5/17 appt.all goals met except assure HEP for D/C. also pt has not returned to work. tolertaed ex well but does show fatigue inRT quad- no increased pain    PT Treatment/Interventions ADLs/Self Care Home Management;Cryotherapy;Electrical Stimulation;Gait training;Neuromuscular re-education;Balance training;Therapeutic exercise;Therapeutic activities;Functional mobility training;Stair training;Patient/family education;Manual techniques;Vasopneumatic Device    PT Next Visit Plan MD note sent with pt. check HEP, Check RTW           Patient will benefit from skilled therapeutic intervention in order to improve the following deficits and impairments:  Abnormal gait,Decreased range of motion,Difficulty walking,Decreased activity tolerance,Pain,Decreased balance,Impaired flexibility,Increased edema,Decreased strength,Decreased mobility  Visit  Diagnosis: Stiffness of right knee, not elsewhere classified  Difficulty in walking, not elsewhere classified     Problem List Patient Active Problem List   Diagnosis Date Noted  . Acute medial meniscus tear of right knee 07/09/2020    Reiko Vinje,ANGIE PTA 10/15/2020, 10:32 AM  [place]. [place], [place], [address] Phone: [phone]   Fax:  [phone]  Name: Vincent Wade MRN: [id number] Date of Birth: [birth year]

## 2020-10-15 NOTE — Progress Notes (Signed)
   Post-Op Visit Note   Patient: Vincent Wade           Date of Birth: 05-Dec-1965           MRN: [id number] Visit Date: 10/15/2020 PCP: Georgina Quint, MD   Assessment & Plan:  Chief Complaint:  Chief Complaint  Patient presents with  . Right Knee - Pain   Visit Diagnoses:  1. S/P right knee arthroscopy     Plan: Patient is a pleasant 55 year old Spanish-speaking gentleman who is here today with an interpreter.  He is 12 weeks out right knee medial meniscus repair.  He has been doing well.  He has been in physical therapy making great progress.  He still notes some start up soreness primarily in the morning which does improve throughout the day.  He is taking tramadol as needed for pain.  Examination of his right knee reveals fully healed surgical portals without evidence of infection or cellulitis.  Minimal medial joint line tenderness.  Range of motion 0 to 120 degrees.  He is neurovascular intact distally.  At this point, I believe he can wean from formal physical therapy to home exercise program.  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  He will follow-up with [place] as needed.  Call with concerns or questions in the meantime.  Follow-Up Instructions: Return if symptoms worsen or fail to improve.   Orders:  No orders of the defined types were placed in this encounter.  No orders of the defined types were placed in this encounter.   Imaging: No new imaging  PMFS History: Patient Active Problem List   Diagnosis Date Noted  . Acute medial meniscus tear of right knee 07/09/2020   Past Medical History:  Diagnosis Date  . GERD (gastroesophageal reflux disease)    OTC meds  . MMT (medial meniscus tear)    right knee    History reviewed. No pertinent family history.  Past Surgical History:  Procedure Laterality Date  . KNEE ARTHROSCOPY WITH  MENISCAL REPAIR Right 07/24/2020   Procedure: RIGHT KNEE ARTHROSCOPY WITH MEDIAL MENISCUS REPAIR;  Surgeon: Tarry Kos, MD;  Location: [facility];  Service: Orthopedics;  Laterality: Right;  . NO PAST SURGERIES     Social History   Occupational History  . Not on file  Tobacco Use  . Smoking status: Never Smoker  . Smokeless tobacco: Never Used  Substance and Sexual Activity  . Alcohol use: Never  . Drug use: Never  . Sexual activity: Not on file

## 2020-10-17 ENCOUNTER — Ambulatory Visit: Payer: 59

## 2020-10-21 ENCOUNTER — Ambulatory Visit: Payer: 59

## 2021-02-18 ENCOUNTER — Emergency Department (HOSPITAL_BASED_OUTPATIENT_CLINIC_OR_DEPARTMENT_OTHER): Payer: 59

## 2021-02-18 ENCOUNTER — Other Ambulatory Visit: Payer: Self-pay

## 2021-02-18 ENCOUNTER — Encounter (HOSPITAL_BASED_OUTPATIENT_CLINIC_OR_DEPARTMENT_OTHER): Payer: Self-pay

## 2021-02-18 ENCOUNTER — Emergency Department (HOSPITAL_BASED_OUTPATIENT_CLINIC_OR_DEPARTMENT_OTHER): Payer: 59 | Admitting: Radiology

## 2021-02-18 DIAGNOSIS — M79602 Pain in left arm: Secondary | ICD-10-CM | POA: Diagnosis not present

## 2021-02-18 DIAGNOSIS — R202 Paresthesia of skin: Secondary | ICD-10-CM | POA: Diagnosis not present

## 2021-02-18 DIAGNOSIS — R079 Chest pain, unspecified: Secondary | ICD-10-CM

## 2021-02-18 LAB — CBC WITH DIFFERENTIAL/PLATELET
Abs Immature Granulocytes: 0.01 10*3/uL (ref 0.00–0.07)
Basophils Absolute: 0 10*3/uL (ref 0.0–0.1)
Basophils Relative: 1 %
Eosinophils Absolute: 0.1 10*3/uL (ref 0.0–0.5)
Eosinophils Relative: 2 %
HCT: 47.6 % (ref 39.0–52.0)
Hemoglobin: 15.6 g/dL (ref 13.0–17.0)
Immature Granulocytes: 0 %
Lymphocytes Relative: 25 %
Lymphs Abs: 1.5 10*3/uL (ref 0.7–4.0)
MCH: 28.1 pg (ref 26.0–34.0)
MCHC: 32.8 g/dL (ref 30.0–36.0)
MCV: 85.6 fL (ref 80.0–100.0)
Monocytes Absolute: 0.4 10*3/uL (ref 0.1–1.0)
Monocytes Relative: 7 %
Neutro Abs: 4 10*3/uL (ref 1.7–7.7)
Neutrophils Relative %: 65 %
Platelets: 246 10*3/uL (ref 150–400)
RBC: 5.56 MIL/uL (ref 4.22–5.81)
RDW: 13.2 % (ref 11.5–15.5)
WBC: 6.1 10*3/uL (ref 4.0–10.5)
nRBC: 0 % (ref 0.0–0.2)

## 2021-02-18 NOTE — ED Triage Notes (Signed)
Patient here POV from Home with Hand Numbness and Anxiety.  Patient states at 0700 he began having Hand Numbness and Tingling to Left Hand.   Ambulatory. NAD Noted during Triage. A&Ox4. GCS 15. No Neurological Deficits noted during Triage.

## 2021-02-18 NOTE — ED Notes (Signed)
Vincent Bunch, MD at Bedside.

## 2021-02-19 ENCOUNTER — Emergency Department (HOSPITAL_BASED_OUTPATIENT_CLINIC_OR_DEPARTMENT_OTHER)
Admission: EM | Admit: 2021-02-19 | Discharge: 2021-02-19 | Disposition: A | Discharge status: Home / Self Care | Payer: 59 | Attending: Emergency Medicine | Admitting: Emergency Medicine

## 2021-02-19 DIAGNOSIS — M79602 Pain in left arm: Secondary | ICD-10-CM

## 2021-02-19 DIAGNOSIS — R202 Paresthesia of skin: Secondary | ICD-10-CM

## 2021-02-19 LAB — COMPREHENSIVE METABOLIC PANEL
ALT: 19 U/L (ref 0–44)
AST: 16 U/L (ref 15–41)
Albumin: 4.5 g/dL (ref 3.5–5.0)
Alkaline Phosphatase: 59 U/L (ref 38–126)
Anion gap: 8 (ref 5–15)
BUN: 16 mg/dL (ref 6–20)
CO2: 26 mmol/L (ref 22–32)
Calcium: 9.7 mg/dL (ref 8.9–10.3)
Chloride: 105 mmol/L (ref 98–111)
Creatinine, Ser: 1.02 mg/dL (ref 0.61–1.24)
GFR, Estimated: >60 mL/min (ref >60)
Glucose, Bld: 107 mg/dL — ABNORMAL HIGH (ref 70–99)
Potassium: 4.2 mmol/L (ref 3.5–5.1)
Sodium: 139 mmol/L (ref 135–145)
Total Bilirubin: 0.5 mg/dL (ref 0.3–1.2)
Total Protein: 7.7 g/dL (ref 6.5–8.1)

## 2021-02-19 LAB — TROPONIN I (HIGH SENSITIVITY)
Troponin I (High Sensitivity): 4 ng/L (ref <18)
Troponin I (High Sensitivity): 4 ng/L (ref <18)

## 2021-02-19 NOTE — ED Provider Notes (Signed)
[facility] EMERGENCY DEPT Provider Note  CSN: [id number] Arrival date & time: 02/18/21 2212  Chief Complaint(s) Numbness  HPI Vincent Wade is a 55 y.o. male here for several hours of left upper extremity paresthesias. Intermittent and sporadic. Starting just above the elbow and traveling down to the hand. Described as numbness and tingling. No alleviating or aggravating factors. No weakness. Patient reports that he was bit by something in the left elbow 2 days ago.   Patient also reported that he slipped and fell from a ladder, 3 feet off the ground onto his knees and hands.  He denied any head trauma or loss of consciousness.Marland Kitchen  He denies any associated chest pain or shortness of breath.  No back pain or neck pain.  No abdominal pain.  The history is provided by the patient.   Past Medical History Past Medical History:  Diagnosis Date   GERD (gastroesophageal reflux disease)    OTC meds   MMT (medial meniscus tear)    right knee   Patient Active Problem List   Diagnosis Date Noted   Acute medial meniscus tear of right knee 07/09/2020   Home Medication(s) Prior to Admission medications   Medication Sig Start Date End Date Taking? Authorizing Provider  HYDROcodone-acetaminophen (NORCO) 5-325 MG tablet Take 1 tablet by mouth every 6 (six) hours as needed. 07/24/20   Tarry Kos, MD  ibuprofen (ADVIL) 400 MG tablet Take 400 mg by mouth every 6 (six) hours as needed.    [provider]  naproxen (NAPROSYN) 500 MG tablet Take 1 tablet (500 mg total) by mouth 2 (two) times daily with a meal. 09/05/20   Wallis Bamberg, PA-C  ondansetron (ZOFRAN) 4 MG tablet Take 1-2 tablets (4-8 mg total) by mouth every 8 (eight) hours as needed for nausea or vomiting. 07/24/20   Tarry Kos, MD  traMADol (ULTRAM) 50 MG tablet Take 1 tablet (50 mg total) by mouth every 12 (twelve) hours as needed. 10/15/20   Ivin Poot                                                                                                                                     Past Surgical History Past Surgical History:  Procedure Laterality Date   KNEE ARTHROSCOPY WITH MENISCAL REPAIR Right 07/24/2020   Procedure: RIGHT KNEE ARTHROSCOPY WITH MEDIAL MENISCUS REPAIR;  Surgeon: Tarry Kos, MD;  Location: [facility];  Service: Orthopedics;  Laterality: Right;   NO PAST SURGERIES     Family History No family history on file.  Social History Social History   Tobacco Use   Smoking status: Never   Smokeless tobacco: Never  Substance Use Topics   Alcohol use: Never   Drug use: Never   Allergies Patient has no known allergies.  Review of Systems Review of Systems All other systems are reviewed and are negative for acute change  except as noted in the HPI  Physical Exam Vital Signs  I have reviewed the triage vital signs BP (!) 149/93 (BP Location: Right Arm)   Pulse (!) 58   Temp 97.6 F (36.4 C) (Oral)   Resp 12   Ht 5\' 6"  (1.676 m)   Wt 77.1 kg   SpO2 99%   BMI 27.44 kg/m   Physical Exam Vitals reviewed.  Constitutional:      General: He is not in acute distress.    Appearance: He is well-developed. He is not diaphoretic.  HENT:     Head: Normocephalic and atraumatic.     Nose: Nose normal.  Eyes:     General: No scleral icterus.       Right eye: No discharge.        Left eye: No discharge.     Conjunctiva/sclera: Conjunctivae normal.     Pupils: Pupils are equal, round, and reactive to light.  Cardiovascular:     Rate and Rhythm: Normal rate and regular rhythm.     Heart sounds: No murmur heard.   No friction rub. No gallop.  Pulmonary:     Effort: Pulmonary effort is normal. No respiratory distress.     Breath sounds: Normal breath sounds. No stridor. No rales.  Abdominal:     General: There is no distension.     Palpations: Abdomen is soft.     Tenderness: There is no abdominal tenderness.  Musculoskeletal:        General: No  tenderness.     Left elbow: No swelling. Normal range of motion. No tenderness.     Right hand: Normal strength. Normal sensation. Normal pulse.     Left hand: Normal range of motion. Normal strength. Normal sensation. Normal pulse.       Arms:     Cervical back: Normal range of motion and neck supple.  Skin:    General: Skin is warm and dry.     Findings: No erythema or rash.  Neurological:     Mental Status: He is alert and oriented to person, place, and time.    ED Results and Treatments Labs (all labs ordered are listed, but only abnormal results are displayed) Labs Reviewed  COMPREHENSIVE METABOLIC PANEL - Abnormal; Notable for the following components:      Result Value   Glucose, Bld 107 (*)    All other components within normal limits  CBC WITH DIFFERENTIAL/PLATELET  TROPONIN I (HIGH SENSITIVITY)  TROPONIN I (HIGH SENSITIVITY)                                                                                                                         EKG  EKG Interpretation  Date/Time:  Tuesday February 18 2021 23:46:06 EDT Ventricular Rate:  62 PR Interval:  130 QRS Duration: 74 QT Interval:  406 QTC Calculation: 412 R Axis:   50 Text Interpretation: Normal sinus rhythm Normal ECG No old tracing to compare Confirmed by  Drema Pry [phone]) on 02/19/2021 12:05:36 AM       Radiology DG Chest 2 View  Result Date: 02/18/2021 CLINICAL DATA:  Hand numbness and anxiety. EXAM: CHEST - 2 VIEW COMPARISON:  None. FINDINGS: Heart size and pulmonary vascularity are normal. Slight fibrosis in the left base. Lungs are otherwise clear. No pleural effusions. No pneumothorax. Mediastinal contours appear intact. IMPRESSION: Slight fibrosis in the left base. No evidence of active pulmonary disease. Electronically Signed   By: Burman Nieves M.D.   On: 02/18/2021 23:40   CT HEAD WO CONTRAST ( )  Result Date: 02/18/2021 CLINICAL DATA:  Paresthesias in the left arm EXAM: CT HEAD  WITHOUT CONTRAST TECHNIQUE: Contiguous axial images were obtained from the base of the skull through the vertex without intravenous contrast. COMPARISON:  None. FINDINGS: Brain: No evidence of acute infarction, hemorrhage, hydrocephalus, extra-axial collection or mass lesion/mass effect. Vascular: No hyperdense vessel. Skull: Normal. Negative for fracture or focal lesion. Sinuses/Orbits: No acute finding. Other: The mastoids are well aerated. IMPRESSION: No acute intracranial process. No etiology is seen for the patient's paresthesias. Electronically Signed   By: Wiliam Ke M.D.   On: 02/18/2021 23:51    Pertinent labs & imaging results that were available during my care of the patient were reviewed by me and considered in my medical decision making (see MDM for details).  Medications Ordered in ED Medications - No data to display                                                                                                                                   Procedures Procedures  (including critical care time)  Medical Decision Making / ED Course I have reviewed the nursing notes for this encounter and the patient's prior records (if available in EHR or on provided paperwork).  Vincent Wade was evaluated in Emergency Department on 02/19/2021 for the symptoms described in the history of present illness. He was evaluated in the context of the global COVID-19 pandemic, which necessitated consideration that the patient might be at risk for infection with the SARS-CoV-2 virus that causes COVID-19. Institutional protocols and algorithms that pertain to the evaluation of patients at risk for COVID-19 are in a state of rapid change based on information released by regulatory bodies including the CDC and federal and state organizations. These policies and algorithms were followed during the patient's care in the ED.     Intermittent left arm paresthesia. NVI. "Bite" is well appearing - not concerning  for deep infection. Screening labs obtained to assess for electrolyte derangements. Will obtain cardiac work-up given left arm symptoms.  Pertinent labs & imaging results that were available during my care of the patient were reviewed by me and considered in my medical decision making:  EKG without acute ischemic changes or evidence of pericarditis. Serial troponins negative x2.  Feel this is sufficient to rule out ACS in this clinical picture. CT  head negative for ICH, mass, or prior stroke. Labs reassuring without leukocytosis or anemia.  No significant electrolyte derangements or renal sufficiency.   Final Clinical Impression(s) / ED Diagnoses Final diagnoses:  Left arm pain  Paresthesia   The patient appears reasonably screened and/or stabilized for discharge and I doubt any other medical condition or other [facility] requiring further screening, evaluation, or treatment in the ED at this time prior to discharge. Safe for discharge with strict return precautions.  Disposition: Discharge  Condition: Good  I have discussed the results, Dx and Tx plan with the patient/family who expressed understanding and agree(s) with the plan. Discharge instructions discussed at length. The patient/family was given strict return precautions who verbalized understanding of the instructions. No further questions at time of discharge.    ED Discharge Orders     None        Follow Up: Georgina Quint, MD [address] [place] [address] [phone]  Call  to schedule an appointment for close follow up     This chart was dictated using voice recognition software.  Despite best efforts to proofread,  errors can occur which can change the documentation meaning.    Nira Conn, MD 02/19/21 [phone]

## 2021-02-20 ENCOUNTER — Ambulatory Visit: Payer: Self-pay

## 2021-02-20 ENCOUNTER — Ambulatory Visit (INDEPENDENT_AMBULATORY_CARE_PROVIDER_SITE_OTHER): Payer: Worker's Compensation | Admitting: Orthopaedic Surgery

## 2021-02-20 ENCOUNTER — Encounter: Payer: Self-pay | Admitting: Orthopaedic Surgery

## 2021-02-20 ENCOUNTER — Telehealth: Payer: Self-pay | Admitting: Orthopaedic Surgery

## 2021-02-20 ENCOUNTER — Other Ambulatory Visit: Payer: Self-pay

## 2021-02-20 DIAGNOSIS — Z9889 Other specified postprocedural states: Secondary | ICD-10-CM | POA: Diagnosis not present

## 2021-02-20 DIAGNOSIS — M25561 Pain in right knee: Secondary | ICD-10-CM

## 2021-02-20 MED ORDER — LIDOCAINE HCL 1 % IJ SOLN
2.0000 mL | INTRAMUSCULAR | Status: AC | PRN
  Administered 2021-02-20: 2 mL

## 2021-02-20 MED ORDER — METHYLPREDNISOLONE ACETATE 40 MG/ML IJ SUSP
40.0000 mg | INTRAMUSCULAR | Status: AC | PRN
  Administered 2021-02-20: 40 mg via INTRA_ARTICULAR

## 2021-02-20 MED ORDER — TRAMADOL HCL 50 MG PO TABS: 50.0000 mg | ORAL_TABLET | Freq: Three times a day (TID) | ORAL | 0 refills | Status: DC | PRN

## 2021-02-20 MED ORDER — BUPIVACAINE HCL 0.25 % IJ SOLN
2.0000 mL | INTRAMUSCULAR | Status: AC | PRN
  Administered 2021-02-20: 2 mL via INTRA_ARTICULAR

## 2021-02-20 NOTE — Progress Notes (Signed)
Office Visit Note   Patient: Vincent Wade           Date of Birth: [birth year]           MRN: [id number] Visit Date: 02/20/2021              Requested by: Georgina Quint, MD [address] [place],  [place] [address] PCP: Georgina Quint, MD   Assessment & Plan: Visit Diagnoses:  1. Acute pain of right knee   2. S/P right knee arthroscopy     Plan: Impression is right knee pain.  At this point, I am not very concerned for meniscal pathology but would like to try diagnostic and hopefully therapeutic cortisone injection.  If he fails to get relief from the injection over the next several weeks he will let us know we will order a repeat MRI to assess for structural abnormalities.  We have provided him with a work note to be out today and then may return with his previous work restrictions.  Follow-up with [place] as needed.  Call with concerns or questions.  Follow-Up Instructions: Return if symptoms worsen or fail to improve.   Orders:  Orders Placed This Encounter  Procedures   Large Joint Inj: R knee   XR KNEE 3 VIEW RIGHT   Meds ordered this encounter  Medications   traMADol (ULTRAM) 50 MG tablet    Sig: Take 1 tablet (50 mg total) by mouth 3 (three) times daily as needed.    Dispense:  30 tablet    Refill:  0      Procedures: Large Joint Inj: R knee on 02/20/2021 10:29 AM Indications: pain Details: 22 G needle, anterolateral approach Medications: 2 mL lidocaine 1 %; 2 mL bupivacaine 0.25 %; 40 mg methylPREDNISolone acetate 40 MG/ML     Clinical Data: No additional findings.   Subjective: Chief Complaint  Patient presents with   Right Knee - Pain    HPI patient is a pleasant 55 year old Spanish-speaking gentleman who is here today with his wife.  Our assistant Marisue Ivan is interpreting.  He sustained an injury while at work 3 days ago when he slipped and fell off of a ladder falling onto his buttocks and injuring his right knee.  He comes in today for  further evaluation treatment recommendation.  The pain he has to the anterior medial aspect.  He denies any locking or catching.  Walking seems to aggravate his symptoms most.  He has been taking Advil without significant relief.  Of note, he has status post right knee arthroscopic debridement medial meniscus from several months ago.  Doing well until this new injury.  Review of Systems as detailed in HPI.  All others reviewed and are negative.   Objective: Vital Signs: There were no vitals taken for this visit.  Physical Exam well-developed well-nourished gentleman in no acute distress.  Alert and oriented x3.  Ortho Exam right knee exam shows no effusion.  Range of motion 0 to 100 degrees.  Medial joint line tenderness.  Ligaments are stable.  He is neurovascular intact distally.  Specialty Comments:  No specialty comments available.  Imaging: No results found.   PMFS History: Patient Active Problem List   Diagnosis Date Noted   Acute medial meniscus tear of right knee 07/09/2020   Past Medical History:  Diagnosis Date   GERD (gastroesophageal reflux disease)    OTC meds   MMT (medial meniscus tear)    right knee  History reviewed. No pertinent family history.  Past Surgical History:  Procedure Laterality Date   KNEE ARTHROSCOPY WITH MENISCAL REPAIR Right 07/24/2020   Procedure: RIGHT KNEE ARTHROSCOPY WITH MEDIAL MENISCUS REPAIR;  Surgeon: Tarry Kos, MD;  Location: [facility];  Service: Orthopedics;  Laterality: Right;   NO PAST SURGERIES     Social History   Occupational History   Not on file  Tobacco Use   Smoking status: Never   Smokeless tobacco: Never  Substance and Sexual Activity   Alcohol use: Never   Drug use: Never   Sexual activity: Not on file

## 2021-02-20 NOTE — Telephone Encounter (Signed)
Amani with the [facility] pharmacy called asking for a diagnostic code for the tramadol rx DR. Xu sent in and would like a CB please.   CB# [phone]

## 2021-02-21 NOTE — Telephone Encounter (Signed)
Diagnosis code given.

## 2021-03-13 ENCOUNTER — Ambulatory Visit (INDEPENDENT_AMBULATORY_CARE_PROVIDER_SITE_OTHER): Payer: Worker's Compensation | Admitting: Orthopaedic Surgery

## 2021-03-13 ENCOUNTER — Other Ambulatory Visit: Payer: Self-pay

## 2021-03-13 ENCOUNTER — Encounter: Payer: Self-pay | Admitting: Orthopaedic Surgery

## 2021-03-13 VITALS — Wt 170.0 lb

## 2021-03-13 DIAGNOSIS — M25561 Pain in right knee: Secondary | ICD-10-CM | POA: Diagnosis not present

## 2021-03-13 NOTE — Progress Notes (Signed)
   Office Visit Note   Patient: Vincent Wade           Date of Birth: 10-28-1965           MRN: [id number] Visit Date: 03/13/2021              Requested by: Georgina Quint, MD [address] [place],  [place] [address] PCP: Georgina Quint, MD   Assessment & Plan: Visit Diagnoses:  1. Acute pain of right knee     Plan: Impression is questionable recurrent medial meniscal tear due to injury at work.  Cortisone injection 3 weeks ago did not provide significant relief therefore will obtain MRI to look for retear of the medial meniscus.  Follow-up after the MRI.  Work note given today.  Follow-Up Instructions: No follow-ups on file.   Orders:  Orders Placed This Encounter  Procedures   MR Knee Right w/o contrast   No orders of the defined types were placed in this encounter.     Procedures: No procedures performed   Clinical Data: No additional findings.   Subjective: Chief Complaint  Patient presents with   Right Knee - Follow-up    HPI Patient returns today for follow-up of right knee pain.  Cortisone injection administered 3 weeks ago which has helped partially.  Continues to have pain on the medial side of the knee and pain at nighttime preventing him from sleeping.  Endorsing some locking symptoms. Review of Systems   Objective: Vital Signs: Wt 170 lb (77.1 kg)   BMI 27.44 kg/m   Physical Exam  Ortho Exam Right knee shows tenderness along the medial joint line and pain with McMurray testing.  Collaterals and cruciates are stable.  Trace effusion.  Specialty Comments:  No specialty comments available.  Imaging: No results found.   PMFS History: Patient Active Problem List   Diagnosis Date Noted   Acute medial meniscus tear of right knee 07/09/2020   Past Medical History:  Diagnosis Date   GERD (gastroesophageal reflux disease)    OTC meds   MMT (medial meniscus tear)    right knee    No family history on file.  Past Surgical  History:  Procedure Laterality Date   KNEE ARTHROSCOPY WITH MENISCAL REPAIR Right 07/24/2020   Procedure: RIGHT KNEE ARTHROSCOPY WITH MEDIAL MENISCUS REPAIR;  Surgeon: Tarry Kos, MD;  Location: [facility];  Service: Orthopedics;  Laterality: Right;   NO PAST SURGERIES     Social History   Occupational History   Not on file  Tobacco Use   Smoking status: Never   Smokeless tobacco: Never  Substance and Sexual Activity   Alcohol use: Never   Drug use: Never   Sexual activity: Not on file

## 2021-03-24 ENCOUNTER — Ambulatory Visit (INDEPENDENT_AMBULATORY_CARE_PROVIDER_SITE_OTHER): Payer: 59 | Admitting: Family Medicine

## 2021-03-24 ENCOUNTER — Other Ambulatory Visit: Payer: Self-pay

## 2021-03-24 ENCOUNTER — Encounter: Payer: Self-pay | Admitting: Family Medicine

## 2021-03-24 ENCOUNTER — Ambulatory Visit (INDEPENDENT_AMBULATORY_CARE_PROVIDER_SITE_OTHER): Payer: 59

## 2021-03-24 VITALS — BP 122/70 | HR 76 | Ht 66.0 in | Wt 172.0 lb

## 2021-03-24 DIAGNOSIS — Z23 Encounter for immunization: Secondary | ICD-10-CM

## 2021-03-24 DIAGNOSIS — Z13220 Encounter for screening for lipoid disorders: Secondary | ICD-10-CM

## 2021-03-24 DIAGNOSIS — N4889 Other specified disorders of penis: Secondary | ICD-10-CM | POA: Diagnosis not present

## 2021-03-24 DIAGNOSIS — Z131 Encounter for screening for diabetes mellitus: Secondary | ICD-10-CM

## 2021-03-24 DIAGNOSIS — Z1159 Encounter for screening for other viral diseases: Secondary | ICD-10-CM | POA: Diagnosis not present

## 2021-03-24 DIAGNOSIS — Z0000 Encounter for general adult medical examination without abnormal findings: Secondary | ICD-10-CM | POA: Diagnosis not present

## 2021-03-24 DIAGNOSIS — Z1211 Encounter for screening for malignant neoplasm of colon: Secondary | ICD-10-CM

## 2021-03-24 NOTE — Patient Instructions (Addendum)
It was great to meet you!  Things we discussed at today's visit: - We are checking some labs. We will send you a letter with the results or call if they are abnormal.  - For the lump on your penis, I think it's a good idea for the specialist (urologist) to look at it. I have placed a referral. Someone will call you to schedule the appointment. Please answer your phone. - We have placed a referral to GI for your colonoscopy (procedure to look for colon cancer). Someone will call you to schedule an appointment.  Take care and seek immediate care sooner if you develop any concerns.  Dr. [facility]

## 2021-03-24 NOTE — Progress Notes (Signed)
   Subjective:   CC: Establish care  HPI:  Vincent Wade is a very pleasant 55 y.o. male who presents today to establish care.  Prior PCP: none, was seen by [place] at [facility] once, otherwise using Urgent Care when he has acute problems.  Initial concerns:  Penile lesion- has been there intermittently for years. Reports he may have had an I&D at one point in the past. Per chart review was seen at Urgent Care in April 2022 and referred to urology but never heard from them. The lump on his penis is present today and has been approximately the same size for ~6 months. It is asymptomatic (not painful, itchy, no drainage, etc). He has not been sexually active in at least 6 months. Previously sexually active with his wife only.  Past medical history: right knee meniscal tear  Past surgical history: right knee arthroscopy (07/24/2020)  Current medications: none (has one for his knee that is prescribed by ortho, doesn't know name, ?tramadol per chart review)  Family history: None that he knows of  Mom- died age 53, unclear cause, no known medical problems Dad- living, age 75, has a pacemaker and has required blood transfusion but he doesn't know other specifics  Social history: works as a Education administrator, lives with wife and children (ages 33, 1, 53) Walks or rides bike twice per week for exercise Denies alcohol, tobacco, or drug use. Smoked cigarettes socially in his late teens/early 51s. Sexually active: not currently  Objective:  BP 122/70   Pulse 76   Ht 5\' 6"  (1.676 m)   Wt 172 lb (78 kg)   SpO2 98%   BMI 27.76 kg/m   Vitals and nursing note reviewed  General: NAD, pleasant, able to participate in exam HEENT: PERRL, oropharynx normal Neck: no cervical lymphadenopathy, thyroid smooth without masses Cardiac: RRR, S1 S2 present. normal heart sounds, no murmurs. Respiratory: CTAB, normal effort, No wheezes, rales or rhonchi Abdomen: soft, non-tender GU: 1x1cm soft, broad-based  fleshy pink mass on L lateral aspect of penis near coronal sulcus. No ulceration, no drainage, nontender to palpation. No other lesions noted. Extremities: no edema or cyanosis. Skin: warm and dry, no rashes noted Neuro: alert, no obvious focal deficits Psych: Normal affect and mood   Assessment & Plan:   Penile lump Present for at least 6 months and is asymptomatic. Etiology unclear. Appears benign on exam although it is fairly large in size. Placed referral for urology for further evaluation as this may require some type of procedural intervention.   Annual Exam/Health Maintenance -Obtain screening lipid panel (last lipids on 05/06/2018 showed total cholesterol 214, LDL 132, HDL 58, triglycerides 121) -Check A1c (BMI 27) -Obtain Hep C antibody -Received flu shot, COVID booster today -Discussed colon cancer screening. Referral placed to GI for screening colonoscopy -PHQ-9 reviewed, no concerns -BP reviewed and at goal   14/10/2017, MD [facility] PGY-2

## 2021-03-25 LAB — LIPID PANEL
Chol/HDL Ratio: 4.4 ratio (ref 0.0–5.0)
Cholesterol, Total: 228 mg/dL — ABNORMAL HIGH (ref 100–199)
HDL: 52 mg/dL (ref >39)
LDL Chol Calc (NIH): 148 mg/dL — ABNORMAL HIGH (ref 0–99)
Triglycerides: 158 mg/dL — ABNORMAL HIGH (ref 0–149)
VLDL Cholesterol Cal: 28 mg/dL (ref 5–40)

## 2021-03-25 LAB — HEMOGLOBIN A1C
Est. average glucose Bld gHb Est-mCnc: 120 mg/dL
Hgb A1c MFr Bld: 5.8 % — ABNORMAL HIGH (ref 4.8–5.6)

## 2021-03-25 LAB — HCV INTERPRETATION

## 2021-03-25 LAB — HCV AB W REFLEX TO QUANT PCR: HCV Ab: <0.1 s/co ratio (ref 0.0–0.9)

## 2021-03-26 DIAGNOSIS — N4889 Other specified disorders of penis: Secondary | ICD-10-CM | POA: Insufficient documentation

## 2021-03-26 NOTE — Assessment & Plan Note (Signed)
Present for at least 6 months and is asymptomatic. Etiology unclear. Appears benign on exam although it is fairly large in size. Placed referral for urology for further evaluation as this may require some type of procedural intervention.

## 2021-03-28 ENCOUNTER — Ambulatory Visit (HOSPITAL_COMMUNITY)
Admission: RE | Admit: 2021-03-28 | Discharge: 2021-03-28 | Disposition: A | Discharge status: Home / Self Care | Payer: 59 | Source: Ambulatory Visit | Attending: Orthopaedic Surgery | Admitting: Orthopaedic Surgery

## 2021-03-28 ENCOUNTER — Other Ambulatory Visit: Payer: Self-pay

## 2021-03-28 DIAGNOSIS — M25561 Pain in right knee: Secondary | ICD-10-CM | POA: Insufficient documentation

## 2021-03-31 NOTE — Progress Notes (Signed)
Needs appt

## 2021-04-01 NOTE — Progress Notes (Signed)
Called patient no answer. Needs to follow up with Dr. Roda Shutters to review MRI Will try again.

## 2021-04-02 ENCOUNTER — Telehealth: Payer: Self-pay

## 2021-04-02 NOTE — Telephone Encounter (Signed)
Called patient no answer LMOM. Need to make him an appt to review MRI

## 2021-04-02 NOTE — Progress Notes (Signed)
APPT MADE

## 2021-04-08 ENCOUNTER — Other Ambulatory Visit: Payer: Self-pay

## 2021-04-08 ENCOUNTER — Encounter: Payer: Self-pay | Admitting: Orthopaedic Surgery

## 2021-04-08 ENCOUNTER — Ambulatory Visit (INDEPENDENT_AMBULATORY_CARE_PROVIDER_SITE_OTHER): Payer: 59 | Admitting: Orthopaedic Surgery

## 2021-04-08 DIAGNOSIS — M25561 Pain in right knee: Secondary | ICD-10-CM | POA: Diagnosis not present

## 2021-04-08 NOTE — Progress Notes (Signed)
   Office Visit Note   Patient: Vincent Wade           Date of Birth: Sep 14, 1965           MRN: [id number] Visit Date: 04/08/2021              Requested by: Maury Dus, MD [address] [place],  [place] [address] PCP: Maury Dus, MD   Assessment & Plan: Visit Diagnoses:  1. Acute pain of right knee     Plan: Overall the patient is doing well and not really symptomatic other than some tightness to the knee.  MRI reviewed which shows that the prior meniscal repair appears to be healed.  I will see any structural problems.  He has mild chondromalacia.  Given these findings I feel that it is fine for him to return back to work with particular restrictions that he has requested.  I have also provided him with exercises to help strengthen his knee.  He should consider using a compression sleeve during activity and at work.  We will see him back as needed.  Total face to face encounter time was greater than 25 minutes and over half of this time was spent in counseling and/or coordination of care.  Follow-Up Instructions: No follow-ups on file.   Orders:  No orders of the defined types were placed in this encounter.  No orders of the defined types were placed in this encounter.     Procedures: No procedures performed   Clinical Data: No additional findings.   Subjective: Chief Complaint  Patient presents with   Right Knee - Pain    Patient returns today to discuss MRI of the right knee.  Wife and interpreter present today.  Overall he reports no pain or mechanical symptoms.  He feels that the knee is tight when he is trying to flex it back.   Review of Systems   Objective: Vital Signs: There were no vitals taken for this visit.  Physical Exam  Ortho Exam  Right knee exam shows no joint effusion.  He has near normal range of motion.  Collaterals and cruciates are stable.  No joint tenderness.  Negative McMurray.  Specialty Comments:  No specialty comments  available.  Imaging: No results found.   PMFS History: Patient Active Problem List   Diagnosis Date Noted   Penile lump 03/26/2021   Acute medial meniscus tear of right knee 07/09/2020   Past Medical History:  Diagnosis Date   GERD (gastroesophageal reflux disease)    OTC meds   MMT (medial meniscus tear)    right knee    History reviewed. No pertinent family history.  Past Surgical History:  Procedure Laterality Date   KNEE ARTHROSCOPY WITH MENISCAL REPAIR Right 07/24/2020   Procedure: RIGHT KNEE ARTHROSCOPY WITH MEDIAL MENISCUS REPAIR;  Surgeon: Tarry Kos, MD;  Location: [facility];  Service: Orthopedics;  Laterality: Right;   NO PAST SURGERIES     Social History   Occupational History   Not on file  Tobacco Use   Smoking status: Never   Smokeless tobacco: Never  Substance and Sexual Activity   Alcohol use: Never   Drug use: Never   Sexual activity: Not on file

## 2021-04-30 ENCOUNTER — Telehealth: Payer: Self-pay | Admitting: Orthopaedic Surgery

## 2021-04-30 NOTE — Telephone Encounter (Signed)
Received vm from Vincent Wade w/ Vincent Wade checking if we received their request for records. When I returned call the person answering spoke spanish and when I asked for English, she hung up on me. I called back and asked for english again and was placed on hold. I hung up after a while holding.  A request from Vincent Wade has not been received.

## 2021-06-20 ENCOUNTER — Telehealth: Payer: Self-pay | Admitting: Orthopaedic Surgery

## 2021-06-20 NOTE — Telephone Encounter (Signed)
Lanora Manis called back, records not found. I emailed to her [email or web address]

## 2021-06-20 NOTE — Telephone Encounter (Signed)
Received vm from [place] @ [facility] checking status of request from 04/14/21. IC,lmvm advised records faxed 11/21. Advised to let me know if she can't locate records and will refax. Ph [phone]

## 2021-07-08 ENCOUNTER — Encounter: Payer: Self-pay | Admitting: Family Medicine

## 2021-08-20 ENCOUNTER — Ambulatory Visit
Admission: EM | Admit: 2021-08-20 | Discharge: 2021-08-20 | Disposition: A | Discharge status: Home / Self Care | Payer: 59 | Attending: Emergency Medicine | Admitting: Emergency Medicine

## 2021-08-20 ENCOUNTER — Other Ambulatory Visit: Payer: Self-pay

## 2021-08-20 DIAGNOSIS — J029 Acute pharyngitis, unspecified: Secondary | ICD-10-CM | POA: Diagnosis present

## 2021-08-20 LAB — POCT RAPID STREP A (OFFICE): Rapid Strep A Screen: NEGATIVE

## 2021-08-20 MED ORDER — CEFDINIR 300 MG PO CAPS: 300.0000 mg | ORAL_CAPSULE | Freq: Two times a day (BID) | ORAL | 0 refills | Status: AC

## 2021-08-20 NOTE — Discharge Instructions (Addendum)
Su prueba de estreptococos de hoy es negativa. El cultivo de garganta se Education officer, environmental? seg?n Science writer. El resultado de su cultivo de garganta se publicar? en su MyChart una vez que est? completo; esto suele demorar de 3 a 5 d?as.  ? ?Debido a que tiene una inflamaci?n significativa de ambas am?gdalas, un enrojecimiento significativo de ambas am?gdalas y manchas blancas en ambas am?gdalas, le recet? Cefdinir 300 mg dos veces al d?a durante 10 d?as para tratar su faringitis bacteriana. Cefdinir cubre el estreptococo del grupo A junto con otros organismos causantes conocidos. Tome este antibi?tico seg?n lo prescrito y no se salte ninguna dosis. Una vez que haya tomado antibi?ticos durante 24 horas completas, ya no se le considerar? contagioso.  ? ?Si recibe una llamada telef?nica [employer] que su cultivo de garganta es negativo, a?n as? le recomiendo encarecidamente que complete toda su prescripci?n de antibi?ticos, independientemente del resultado de su cultivo de garganta, especialmente si comienza a sentirse mejor dentro de las primeras 48 horas de terapia. . El cultivo de garganta que realizamos aqu? en la sala de urgencias solo analiza el estreptococo del grupo A y ninguna de las otras bacterias que tambi?n pueden causar faringitis bacteriana. Haga un seguimiento con su proveedor de atenci?n primaria en la pr?xima semana a diez d?as para repetir la evaluaci?n si no ha tenido una resoluci?n completa de sus s?ntomas.  ? ?Le hicieron la prueba de COVID y de influenza hoy, el resultado de su prueba viral se publicar? en su MyChart una vez que est? completo, esto generalmente toma de 24 a 48 horas. Si hay un resultado positivo, lo contactaremos por tel?fono con m?s recomendaciones, si corresponde.  ? ?Su presi?n arterial est? muy elevada hoy. Haga un seguimiento con su proveedor de atenci?n primaria para analizar el control de su presi?n arterial alta.  ? ?Haga un seguimiento dentro de los pr?ximos 7 a 10 d?as,  ya sea con su proveedor de atenci?n primaria o con atenci?n de urgencia si sus s?ntomas no se resuelven. Si no tiene un proveedor de atenci?n primaria, lo ayudaremos a Dealer.  ? ?Gracias por visitar la atenci?n de urgencia hoy. Agradecemos la oportunidad de participar en su atenci?n.  ? ? ?Your strep test today is negative.  Throat culture will be performed per our protocol.  The result of your throat culture will be posted to your MyChart once it is complete, this typically takes 3 to 5 days.   ?  ?Because you have significant swelling of both tonsils, significant redness of both tonsils and white patches on both tonsils, I have prescribed Cefdinir 300 mg twice daily for 10 days to treat you for bacterial pharyngitis.  Cefdinir covers group A strep along with other known causative organisms.  Please take this antibiotic as prescribed and do not skip any doses.  Once you have been on antibiotics for a full 24 hours, you are no longer considered contagious.     ?  ?If you receive a phone call telling you that your throat culture is negative, I still strongly recommend that you complete your entire prescription of antibiotics regardless of the result of your throat culture, particularly if you begin to feel better within the first 48 hours of therapy.  The throat culture we perform here at urgent care only tests for group A strep and not any of the other bacteria that can also cause bacterial pharyngitis.  Please follow-up with your primary care provider in the next week to ten  days for repeat evaluation if you have not had complete resolution of your symptoms.    ? ?You were tested for both COVID and influenza today, the result of your viral testing will be posted to your MyChart once it is complete, this typically takes 24 to 48 hours.  If there is a positive result, you will be contacted by phone with further recommendations, if any.  ?  ?Your blood pressure is very elevated today.  Please follow-up with  your primary care provider to discuss management of your high blood pressure. ? ?Please follow-up within the next 7-10 days either with your primary care provider or urgent care if your symptoms do not resolve.  If you do not have a primary care provider, we will assist you in finding one. ?  ?Thank you for visiting urgent care today.  We appreciate the opportunity to participate in your care. ? ?

## 2021-08-20 NOTE — ED Triage Notes (Signed)
Patient states he has a throat infection, he reports having a sore throat, with some inflammation.  ? ?Started: Friday ?

## 2021-08-20 NOTE — ED Provider Notes (Signed)
?[facility] ? ? ? ?CSN: [id number] ?Arrival date & time: 08/20/21  1207 ?  ? ?HISTORY  ? ?Chief Complaint  ?Patient presents with  ? Sore Throat  ? ?HPI ?Vincent Wade is a 56 y.o. male. Patient states he has a throat infection, he reports having a sore throat with some inflammation that is gotten progressively worse for the past 5 days.  Patient states he has not tried any remedies for his symptoms.  Patient states nothing makes his symptoms better nothing makes them worse.  Patient denies known sick contacts. ? ?The history is provided by the patient. A language interpreter was used.  ?Past Medical History:  ?Diagnosis Date  ? GERD (gastroesophageal reflux disease)   ? OTC meds  ? MMT (medial meniscus tear)   ? right knee  ? ?Patient Active Problem List  ? Diagnosis Date Noted  ? Penile lump 03/26/2021  ? Acute medial meniscus tear of right knee 07/09/2020  ? ?Past Surgical History:  ?Procedure Laterality Date  ? KNEE ARTHROSCOPY WITH MENISCAL REPAIR Right 07/24/2020  ? Procedure: RIGHT KNEE ARTHROSCOPY WITH MEDIAL MENISCUS REPAIR;  Surgeon: Tarry Kos, MD;  Location: [facility];  Service: Orthopedics;  Laterality: Right;  ? NO PAST SURGERIES    ? ? ?Home Medications   ? ?Prior to Admission medications   ?Medication Sig Start Date End Date Taking? Authorizing Provider  ?ibuprofen (ADVIL) 400 MG tablet Take 400 mg by mouth every 6 (six) hours as needed.    [provider]  ?traMADol (ULTRAM) 50 MG tablet Take 1 tablet (50 mg total) by mouth 3 (three) times daily as needed. 02/20/21   Cristie Hem, PA-C  ? ?Family History ?History reviewed. No pertinent family history. ?Social History ?Social History  ? ?Tobacco Use  ? Smoking status: Never  ? Smokeless tobacco: Never  ?Substance Use Topics  ? Alcohol use: Never  ? Drug use: Never  ? ?Allergies   ?Patient has no known allergies. ? ?Review of Systems ?Review of Systems ?Pertinent findings noted in history of present illness.   ? ?Physical Exam ?Triage Vital Signs ?ED Triage Vitals  ?Enc Vitals Group  ?   BP 03/28/21 0827 (!) 147/82  ?   Pulse Rate 03/28/21 0827 72  ?   Resp 03/28/21 0827 18  ?   Temp 03/28/21 0827 98.3 ?F (36.8 ?C)  ?   Temp Source 03/28/21 0827 Oral  ?   SpO2 03/28/21 0827 98 %  ?   Weight --   ?   Height --   ?   Head Circumference --   ?   Peak Flow --   ?   Pain Score 03/28/21 0826 5  ?   Pain Loc --   ?   Pain Edu? --   ?   Excl. in GC? --   ?No data found. ? ?Updated Vital Signs ?BP (!) 162/83 (BP Location: Right Arm)   Pulse 60   Temp 97.8 ?F (36.6 ?C) (Oral)   Resp 18   SpO2 97%  ? ?Physical Exam ?Constitutional:   ?   General: He is not in acute distress. ?   Appearance: He is well-developed. He is ill-appearing. He is not toxic-appearing.  ?HENT:  ?   Head: Normocephalic and atraumatic.  ?   Salivary Glands: Right salivary gland is diffusely enlarged and tender. Left salivary gland is diffusely enlarged and tender.  ?   Right Ear: Hearing and external ear  normal.  ?   Left Ear: Hearing and external ear normal.  ?   Ears:  ?   Comments: Bilateral EACs with mild erythema, bilateral TMs are normal ?   Nose: No mucosal edema, congestion or rhinorrhea.  ?   Right Turbinates: Not enlarged, swollen or pale.  ?   Left Turbinates: Not enlarged or swollen.  ?   Right Sinus: No maxillary sinus tenderness or frontal sinus tenderness.  ?   Left Sinus: No maxillary sinus tenderness or frontal sinus tenderness.  ?   Mouth/Throat:  ?   Lips: Pink. No lesions.  ?   Mouth: Mucous membranes are moist. No oral lesions or angioedema.  ?   Dentition: No gingival swelling.  ?   Tongue: No lesions.  ?   Palate: No mass.  ?   Pharynx: Uvula midline. Pharyngeal swelling, oropharyngeal exudate and posterior oropharyngeal erythema present. No uvula swelling.  ?   Tonsils: Tonsillar exudate present. 2+ on the right. 2+ on the left.  ?Eyes:  ?   Extraocular Movements: Extraocular movements intact.  ?   Conjunctiva/sclera: Conjunctivae  normal.  ?   Pupils: Pupils are equal, round, and reactive to light.  ?Neck:  ?   Thyroid: No thyroid mass, thyromegaly or thyroid tenderness.  ?   Trachea: Tracheal tenderness present. No abnormal tracheal secretions or tracheal deviation.  ?   Comments: Voice is muffled ?Cardiovascular:  ?   Rate and Rhythm: Normal rate and regular rhythm.  ?   Pulses: Normal pulses.  ?   Heart sounds: Normal heart sounds, S1 normal and S2 normal. No murmur heard. ?  No friction rub. No gallop.  ?Pulmonary:  ?   Effort: Pulmonary effort is normal. No accessory muscle usage, prolonged expiration, respiratory distress or retractions.  ?   Breath sounds: No stridor, decreased air movement or transmitted upper airway sounds. No decreased breath sounds, wheezing, rhonchi or rales.  ?Abdominal:  ?   General: Bowel sounds are normal.  ?   Palpations: Abdomen is soft.  ?   Tenderness: There is generalized abdominal tenderness. There is no right CVA tenderness, left CVA tenderness or rebound. Negative signs include Murphy's sign.  ?   Hernia: No hernia is present.  ?Musculoskeletal:     ?   General: No tenderness. Normal range of motion.  ?   Cervical back: Full passive range of motion without pain, normal range of motion and neck supple.  ?   Right lower leg: No edema.  ?   Left lower leg: No edema.  ?Lymphadenopathy:  ?   Cervical: Cervical adenopathy present.  ?   Right cervical: Superficial cervical adenopathy present.  ?   Left cervical: Superficial cervical adenopathy present.  ?Skin: ?   General: Skin is warm and dry.  ?   Findings: No erythema, lesion or rash.  ?Neurological:  ?   General: No focal deficit present.  ?   Mental Status: He is alert and oriented to person, place, and time. Mental status is at baseline.  ?Psychiatric:     ?   Mood and Affect: Mood normal.     ?   Behavior: Behavior normal.     ?   Thought Content: Thought content normal.     ?   Judgment: Judgment normal.  ? ? ?Visual Acuity ?Right Eye Distance:    ?Left Eye Distance:   ?Bilateral Distance:   ? ?Right Eye Near:   ?Left Eye Near:    ?  Bilateral Near:    ? ?UC Couse / Diagnostics / Procedures:  ?  ?EKG ? ?Radiology ?No results found. ? ?Procedures ?Procedures (including critical care time) ? ?UC Diagnoses / Final Clinical Impressions(s)   ?I have reviewed the triage vital signs and the nursing notes. ? ?Pertinent labs & imaging results that were available during my care of the patient were reviewed by me and considered in my medical decision making (see chart for details).   ?Final diagnoses:  ?Acute pharyngitis, unspecified etiology  ? ?Patient will be treated empirically for presumed bacterial pharyngitis based on history provided and physical exam findings.  Rapid strep test is negative, throat culture will be performed per protocol.  Patient provided with a 10-day course of cefdinir.  Return precautions advised.  Patient provided with a note for work.  Patient advised to remain away from others for the first 24 hours of antibiotic therapy avoid spreading this to others. ? ?ED Prescriptions   ? ? Medication Sig Dispense Auth. Provider  ? cefdinir (OMNICEF) 300 MG capsule Take 1 capsule (300 mg total) by mouth 2 (two) times daily for 10 days. 20 capsule Theadora RamaMorgan, Octavio Matheney Scales, PA-C  ? ?  ? ?PDMP not reviewed this encounter. ? ?Pending results:  ?Labs Reviewed  ?COVID-19, FLU A+B NAA  ?CULTURE, GROUP A STREP [facility]  ?POCT RAPID STREP A (OFFICE)  ? ? ?Medications Ordered in UC: ?Medications - No data to display ? ?Disposition Upon Discharge:  ?Condition: stable for discharge home ?Home: take medications as prescribed; routine discharge instructions as discussed; follow up as advised. ? ?Patient presented with an acute illness with associated systemic symptoms and significant discomfort requiring urgent management. In my opinion, this is a condition that a prudent lay person (someone who possesses an average knowledge of health and medicine) may potentially  expect to result in complications if not addressed urgently such as respiratory distress, impairment of bodily function or dysfunction of bodily organs.  ? ?Routine symptom specific, illness specific and/or disease sp

## 2021-08-22 LAB — COVID-19, FLU A+B NAA
Influenza A, NAA: NOT DETECTED
Influenza B, NAA: NOT DETECTED
SARS-CoV-2, NAA: NOT DETECTED

## 2021-08-22 LAB — CULTURE, GROUP A STREP (THRC)

## 2022-02-16 IMAGING — MR MR KNEE*R* W/O CM
6 series · 38 of 40 positions shown · non-contrast
Comparison: Radiographs 02/20/2021 and prior MRI of the knee from
04/24/2020

CLINICAL DATA: History knee trauma, reported surgery 8 months ago.

EXAM:
MRI OF THE RIGHT KNEE WITHOUT CONTRAST
TECHNIQUE: Multiplanar, multisequence MR imaging of the knee was performed. No
intravenous contrast was administered.

[Series 5: T2 fat-sat · axial · right · 4.0mm · 0.50mm/px · z∈[-90,+51]mm · 8 of 33 slices shown (1 of 3)]
[im 1/33]
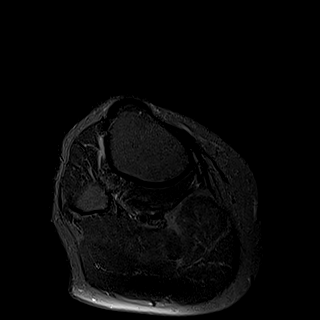
[im 5/33]
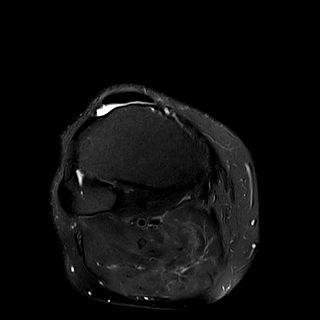
[im 10/33]
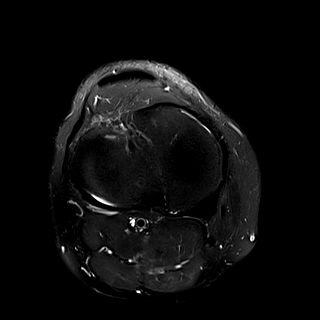
[im 14/33]
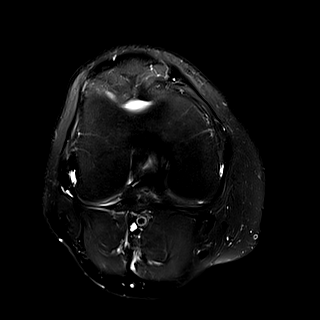
[im 19/33]
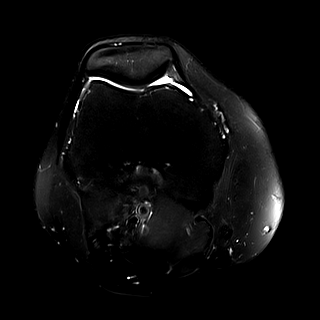
[im 23/33]
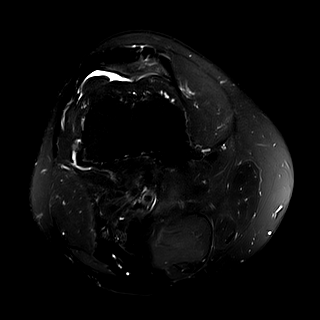
[im 28/33]
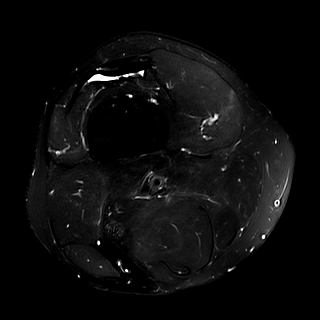
[im 33/33]
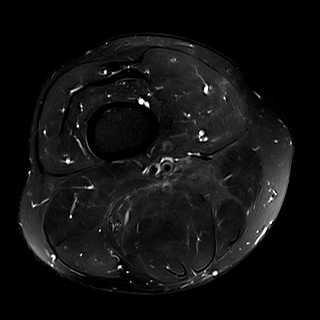

[Series 6: T2 fat-sat · coronal · right · 4.0mm · 0.59mm/px · 6 of 25 slices shown (2 of 3)]
[im 1/25]
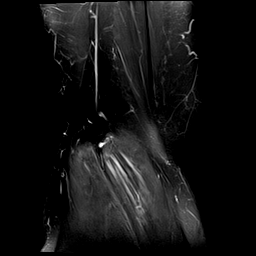
[im 5/25]
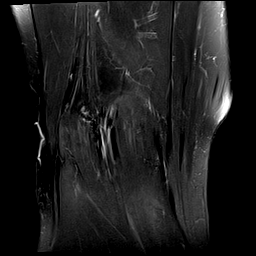
[im 10/25]
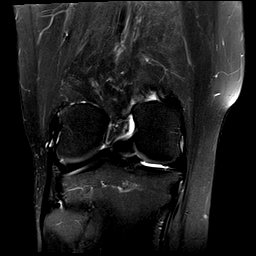
[im 15/25]
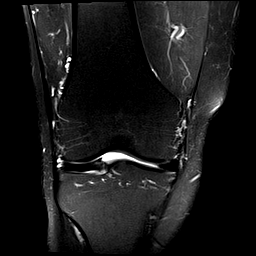
[im 20/25]
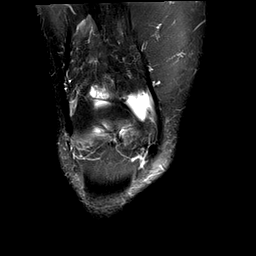
[im 25/25]
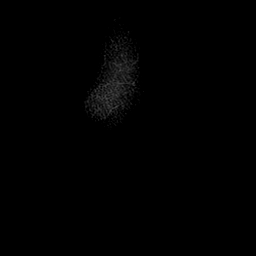

[Series 7: T1 · coronal · right · 4.0mm · 0.29mm/px · 4 of 25 slices shown]
[im 1/25]
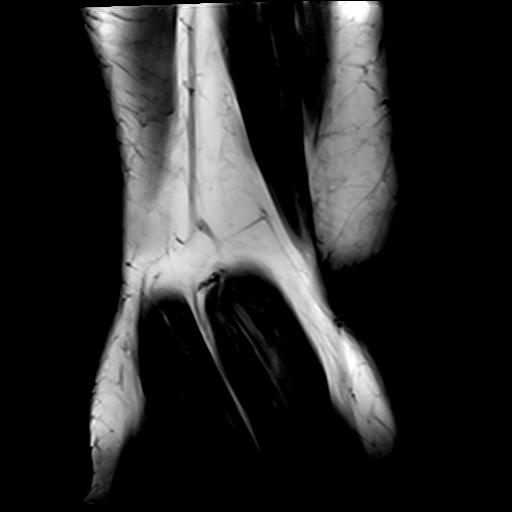
[im 5/25]
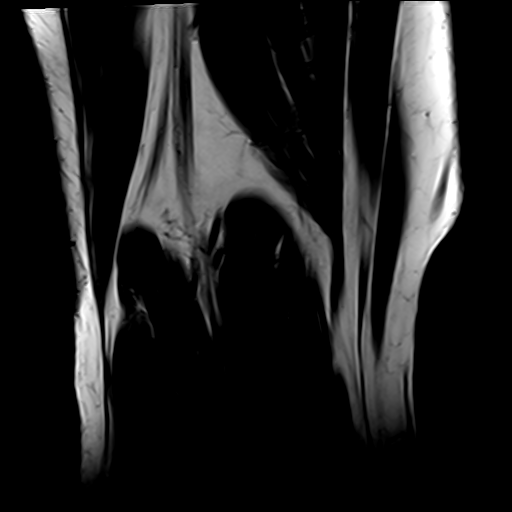
[im 10/25]
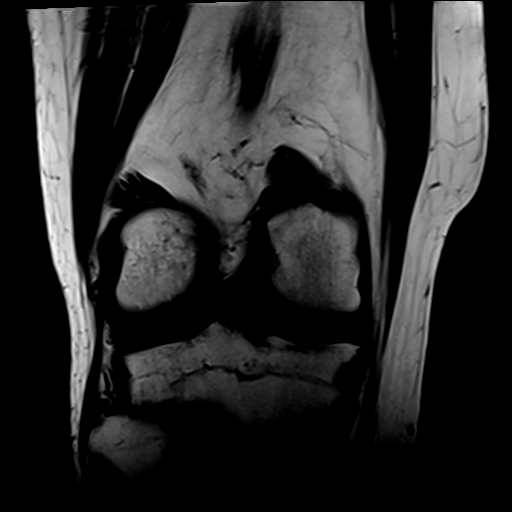
[im 15/25]
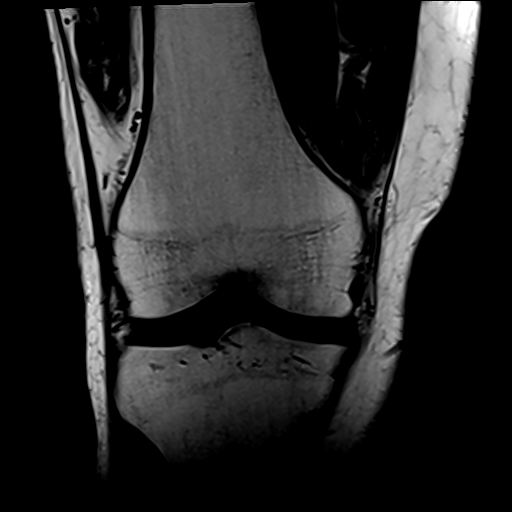

[Series 8: PD fat-sat · coronal · right · 3.0mm · 0.47mm/px · 8 of 32 slices shown (1 of 2)]
[im 1/32]
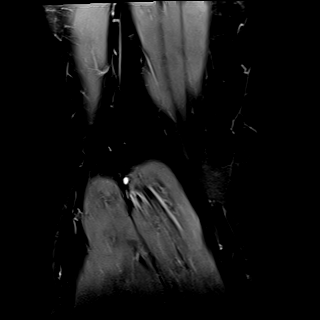
[im 5/32]
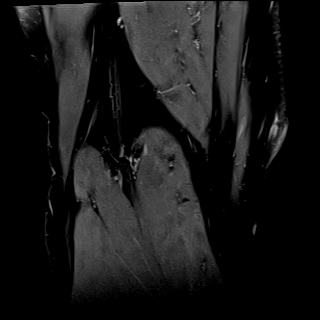
[im 9/32]
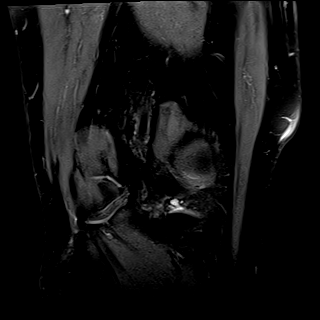
[im 14/32]
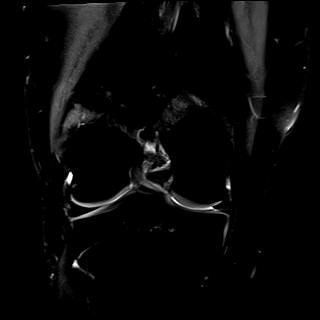
[im 18/32]
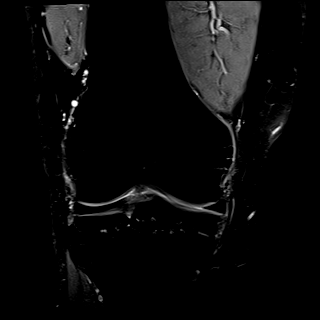
[im 23/32]
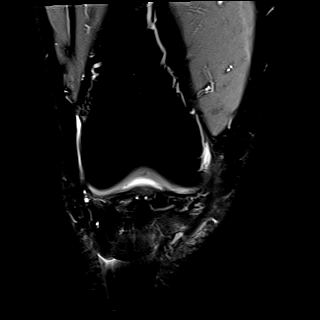
[im 27/32]
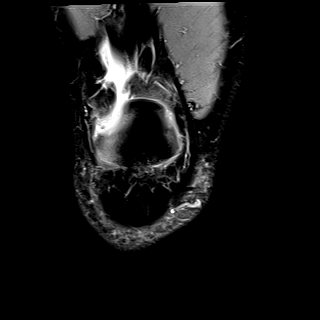
[im 32/32]
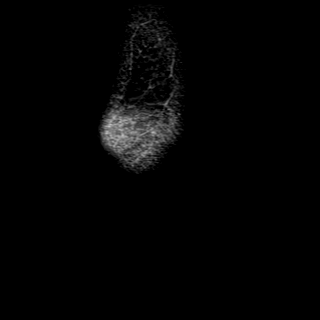

[Series 9: T2 fat-sat · sagittal · right · 4.0mm · 0.47mm/px · 6 of 22 slices shown (3 of 3)]
[im 1/22]
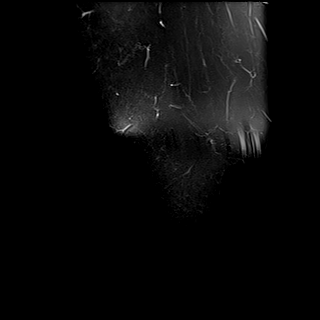
[im 5/22]
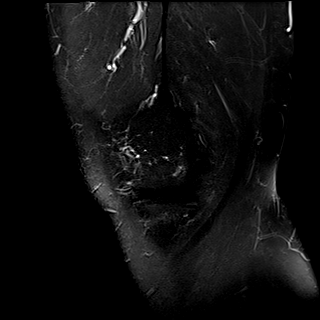
[im 9/22]
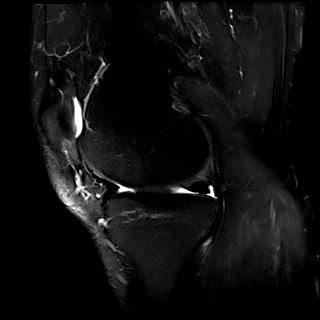
[im 13/22]
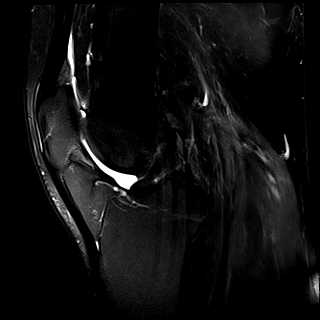
[im 17/22]
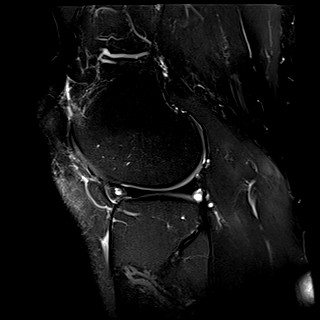
[im 22/22]
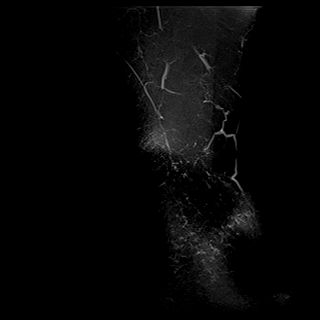

[Series 10: PD fat-sat · sagittal · right · 4.0mm · 0.47mm/px · 6 of 22 slices shown (2 of 2)]
[im 1/22]
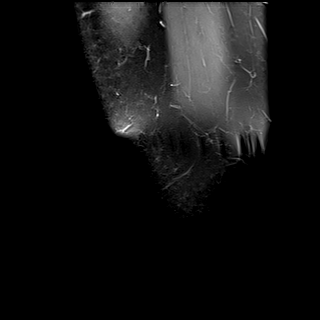
[im 5/22]
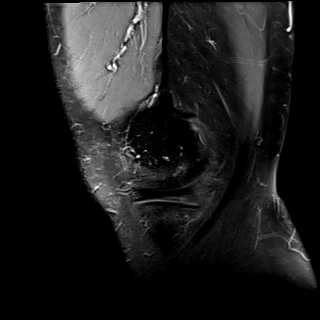
[im 9/22]
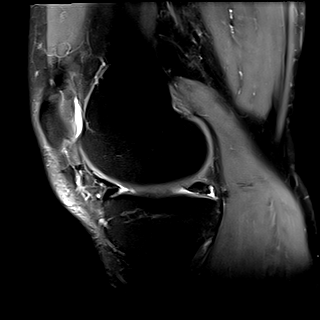
[im 13/22]
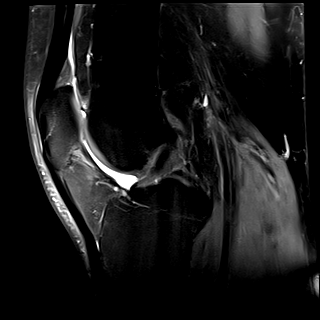
[im 17/22]
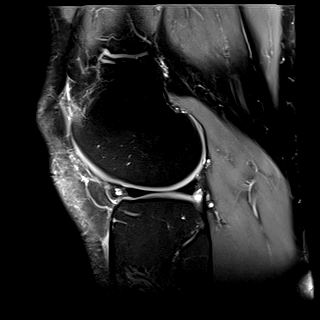
[im 22/22]
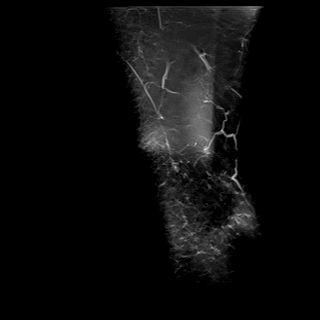

[38 of 40 positions shown; findings below may reference images not displayed]

FINDINGS: MENISCI

Medial meniscus: Mild grade 3 signal along the inferior surface and
free edge of the posterior horn on images 11 through 14 of series 8,
probably postoperative signal from partial meniscectomy given the
overall appearance, with some mild fraying/irregularity. Suspected
0.6 by 0.6 by 0.5 cm parameniscal cyst along the periphery of the
posterior horn on images 9-10 of series 10, not readily apparent on
the prior exam.

Lateral meniscus:  Unremarkable

LIGAMENTS

Cruciates:  Unremarkable

Collaterals:  Unremarkable

CARTILAGE

Patellofemoral: Questionable mild chondromalacia along the posterior
patellar ridge on image 14 series 6.

Medial: Minimal chondral fissuring posteriorly along the medial
femoral condyle on image 12 series 8.

Lateral:  Unremarkable

Joint: Fibrosis along the posterior margin of Hoffa's fat pad
especially in the region of the transverse malignant is cool
ligament. There is some low signal intensity fibrosis medially in
Hoffa's fat pad for example on images 20-21 of series 5. Mildly
thickened suprapatellar plica, for example on image 14 series 5.
Small knee effusion.

Popliteal Fossa:  Unremarkable

Extensor Mechanism: Subtle linear increased signal in the distal
quadriceps tendon is likely incidental but in the appropriate
clinical setting could represent a quadriceps sprain.

Bones: No significant extra-articular osseous abnormalities
identified.

Other: No supplemental non-categorized findings.
IMPRESSION: 1. Mild grade 3 signal along the inferior surface and free edge of
the posterior horn medial meniscus, probably postoperative signal
from partial meniscectomy although there is some mild fraying in
this region along with a tiny marginal parameniscal cyst.
2. Fibrosis along portions of Hoffa's fat pad including the
posterior and medial margins of the fat pad, transverse meniscal
ligament. Potentially mildly thickened suprapatellar plica. Small
knee effusion. Subtle linear increased signal in the distal
quadriceps tendon is likely incidental but in the appropriate
clinical setting could represent a quadriceps sprain.
3. Questionable minimal chondromalacia along the posterior patellar
ridge seen on a single image.
4. Minimal chondral fissuring posteriorly along the medial femoral
condyle.

## 2022-03-14 ENCOUNTER — Ambulatory Visit
Admission: EM | Admit: 2022-03-14 | Discharge: 2022-03-14 | Disposition: A | Discharge status: Home / Self Care | Payer: Commercial Managed Care - HMO | Attending: Urgent Care | Admitting: Urgent Care

## 2022-03-14 ENCOUNTER — Encounter: Payer: Self-pay | Admitting: *Deleted

## 2022-03-14 ENCOUNTER — Ambulatory Visit (INDEPENDENT_AMBULATORY_CARE_PROVIDER_SITE_OTHER): Payer: Commercial Managed Care - HMO

## 2022-03-14 DIAGNOSIS — M47812 Spondylosis without myelopathy or radiculopathy, cervical region: Secondary | ICD-10-CM | POA: Diagnosis not present

## 2022-03-14 DIAGNOSIS — M5030 Other cervical disc degeneration, unspecified cervical region: Secondary | ICD-10-CM

## 2022-03-14 DIAGNOSIS — M542 Cervicalgia: Secondary | ICD-10-CM

## 2022-03-14 MED ORDER — DICLOFENAC SODIUM 1 % EX GEL: 2.0000 g | Freq: Four times a day (QID) | CUTANEOUS | 0 refills | Status: DC

## 2022-03-14 MED ORDER — MELOXICAM 7.5 MG PO TABS: 7.5000 mg | ORAL_TABLET | Freq: Every day | ORAL | 0 refills | Status: DC

## 2022-03-14 NOTE — ED Provider Notes (Signed)
[facility]    CSN: [id number] Arrival date & time: 03/14/22  1500      History   Chief Complaint No chief complaint on file.   HPI Vincent Wade is a 56 y.o. male.   Pleasant 56 year old male presents today due to concern of neck pain.  He reports the pain feels like a pressure worse over his C7 bone midline.  He states bending his neck and then looking up causes increased pain.  He reports also laying back seems to increase the discomfort.  He denies any radiation to his shoulders or lower back.  It does occasionally shoot lancinating pains up his head.  He denies any injury or trauma.  He has full range of motion, but states that flexion causes increased discomfort.  He has tried 600 mg of ibuprofen every 8 hours with minimal relief.  He has never had a pain like this in the past.  He denies any URI symptoms.  He denies any swelling or bruising.  No rash to the area. Mild headache, but no photophobia, nuchal rigidity.    Past Medical History:  Diagnosis Date  . GERD (gastroesophageal reflux disease)    OTC meds  . MMT (medial meniscus tear)    right knee    Patient Active Problem List   Diagnosis Date Noted  . Penile lump 03/26/2021  . Acute medial meniscus tear of right knee 07/09/2020    Past Surgical History:  Procedure Laterality Date  . KNEE ARTHROSCOPY WITH MENISCAL REPAIR Right 07/24/2020   Procedure: RIGHT KNEE ARTHROSCOPY WITH MEDIAL MENISCUS REPAIR;  Surgeon: Tarry Kos, MD;  Location: [facility];  Service: Orthopedics;  Laterality: Right;       Home Medications    Prior to Admission medications   Medication Sig Start Date End Date Taking? Authorizing Provider  ibuprofen (ADVIL) 400 MG tablet Take 400 mg by mouth every 6 (six) hours as needed.   Yes [provider]    Family History History reviewed. No pertinent family history.  Social History Social History   Tobacco Use  . Smoking status: Former    Types:  Cigarettes  . Smokeless tobacco: Never  Vaping Use  . Vaping Use: Never used  Substance Use Topics  . Alcohol use: Yes    Comment: "very little"  . Drug use: Never     Allergies   Patient has no known allergies.   Review of Systems Review of Systems   Physical Exam Triage Vital Signs ED Triage Vitals  Enc Vitals Group     BP 03/14/22 1518 130/72     Pulse Rate 03/14/22 1518 (!) 58     Resp 03/14/22 1518 16     Temp 03/14/22 1518 98.6 F (37 C)     Temp Source 03/14/22 1518 Oral     SpO2 03/14/22 1518 96 %     Weight --      Height --      Head Circumference --      Peak Flow --      Pain Score 03/14/22 1527 8     Pain Loc --      Pain Edu? --      Excl. in GC? --    No data found.  Updated Vital Signs BP 130/72 (BP Location: Left Arm)   Pulse (!) 58   Temp 98.6 F (37 C) (Oral)   Resp 16   SpO2 96%   Visual Acuity Right Eye  Distance:   Left Eye Distance:   Bilateral Distance:    Right Eye Near:   Left Eye Near:    Bilateral Near:     Physical Exam   UC Treatments / Results  Labs (all labs ordered are listed, but only abnormal results are displayed) Labs Reviewed - No data to display  EKG   Radiology No results found.  Procedures Procedures (including critical care time)  Medications Ordered in UC Medications - No data to display  Initial Impression / Assessment and Plan / UC Course  I have reviewed the triage vital signs and the nursing notes.  Pertinent labs & imaging results that were available during my care of the patient were reviewed by me and considered in my medical decision making (see chart for details).     *** Final Clinical Impressions(s) / UC Diagnoses   Final diagnoses:  None   Discharge Instructions   None    ED Prescriptions   None    PDMP not reviewed this encounter.

## 2022-03-14 NOTE — ED Triage Notes (Signed)
Via [employer] video interpreter [phone]: C/O posterior neck pain x3 days without any known injury. Pain worse with any head movements. States has been taking IBU without relief.

## 2022-03-14 NOTE — Discharge Instructions (Addendum)
Yo envie un medicamento para tomar una vez cada dia. Si necessitas, puedes tomar [employer]. Puedes poner algo caliente en ese lugar para relajar los musculos. Tambien, hay un gel se llama Voltaren tu puedes poner 4 veces cada dia. Si tienes ese dolor todavia, necessitas hablar con un especialista. Tratar a Pharmacologist.

## 2023-05-15 ENCOUNTER — Encounter (HOSPITAL_BASED_OUTPATIENT_CLINIC_OR_DEPARTMENT_OTHER): Payer: Self-pay

## 2023-05-15 ENCOUNTER — Other Ambulatory Visit: Payer: Self-pay

## 2023-05-15 DIAGNOSIS — R55 Syncope and collapse: Secondary | ICD-10-CM | POA: Diagnosis present

## 2023-05-15 LAB — CBC
HCT: 46.5 % (ref 39.0–52.0)
Hemoglobin: 15.6 g/dL (ref 13.0–17.0)
MCH: 28.9 pg (ref 26.0–34.0)
MCHC: 33.5 g/dL (ref 30.0–36.0)
MCV: 86.3 fL (ref 80.0–100.0)
Platelets: 251 10*3/uL (ref 150–400)
RBC: 5.39 MIL/uL (ref 4.22–5.81)
RDW: 13 % (ref 11.5–15.5)
WBC: 7.4 10*3/uL (ref 4.0–10.5)
nRBC: 0 % (ref 0.0–0.2)

## 2023-05-15 LAB — BASIC METABOLIC PANEL
Anion gap: 8 (ref 5–15)
BUN: 18 mg/dL (ref 6–20)
CO2: 29 mmol/L (ref 22–32)
Calcium: 9.8 mg/dL (ref 8.9–10.3)
Chloride: 101 mmol/L (ref 98–111)
Creatinine, Ser: 1.04 mg/dL (ref 0.61–1.24)
GFR, Estimated: >60 mL/min (ref >60)
Glucose, Bld: 134 mg/dL — ABNORMAL HIGH (ref 70–99)
Potassium: 3.9 mmol/L (ref 3.5–5.1)
Sodium: 138 mmol/L (ref 135–145)

## 2023-05-15 NOTE — ED Triage Notes (Signed)
Spanish speaking Pt to triage via self ambulation c/o dizziness x 1 hour prior to arrival. Family to translate at Pt request. NIH 0 Pt has no unilateral deficits. Pt denies fall CP SOB and is on room air. VSS NAD

## 2023-05-16 ENCOUNTER — Emergency Department (HOSPITAL_BASED_OUTPATIENT_CLINIC_OR_DEPARTMENT_OTHER): Payer: BLUE CROSS/BLUE SHIELD | Admitting: Radiology

## 2023-05-16 ENCOUNTER — Emergency Department (HOSPITAL_BASED_OUTPATIENT_CLINIC_OR_DEPARTMENT_OTHER): Payer: BLUE CROSS/BLUE SHIELD

## 2023-05-16 ENCOUNTER — Emergency Department (HOSPITAL_BASED_OUTPATIENT_CLINIC_OR_DEPARTMENT_OTHER)
Admission: EM | Admit: 2023-05-16 | Discharge: 2023-05-16 | Disposition: A | Discharge status: Home / Self Care | Payer: BLUE CROSS/BLUE SHIELD | Attending: Emergency Medicine | Admitting: Emergency Medicine

## 2023-05-16 DIAGNOSIS — R55 Syncope and collapse: Secondary | ICD-10-CM

## 2023-05-16 LAB — URINALYSIS, ROUTINE W REFLEX MICROSCOPIC
Bilirubin Urine: NEGATIVE
Glucose, UA: NEGATIVE mg/dL
Hgb urine dipstick: NEGATIVE
Ketones, ur: NEGATIVE mg/dL
Leukocytes,Ua: NEGATIVE
Nitrite: NEGATIVE
Protein, ur: NEGATIVE mg/dL
Specific Gravity, Urine: 1.007 (ref 1.005–1.030)
pH: 7 (ref 5.0–8.0)

## 2023-05-16 LAB — D-DIMER, QUANTITATIVE: D-Dimer, Quant: 0.34 ug{FEU}/mL (ref 0.00–0.50)

## 2023-05-16 LAB — TROPONIN I (HIGH SENSITIVITY)
Troponin I (High Sensitivity): 2 ng/L (ref <18)
Troponin I (High Sensitivity): 3 ng/L (ref <18)

## 2023-05-16 MED ORDER — IBUPROFEN 800 MG PO TABS
800.0000 mg | ORAL_TABLET | Freq: Once | ORAL | Status: AC
Start: 2023-05-16 — End: 2023-05-16
  Administered 2023-05-16: 800 mg via ORAL
  Filled 2023-05-16: qty 1

## 2023-05-16 NOTE — ED Notes (Signed)
Updated assessment completed with interpreter. Pt c/o of left shoulder pain worse upon movement. Pt VSS NAD PT on room air. Pt and family educated on plan of care to be seen by Dr ASAP. Pt and family verbalized complete understanding and denies questions at this time.

## 2023-05-16 NOTE — ED Notes (Signed)
Pt discharged home and given discharge paperwork. Opportunities given for questions. Pt verbalizes understanding. PIV removed x1. Stone,Heather R , RN 

## 2023-05-16 NOTE — ED Notes (Signed)
Pt pass fluid challenge.

## 2023-05-16 NOTE — ED Provider Notes (Signed)
7:06 AM Patient signed out to me by previous ED physician. Pt is a 57 yo male presenting for light headedness and right shoulder pain post fall.  P: trops pending   CXR and shoulder xray stable. Physical Exam  BP 119/84   Pulse (!) 53   Temp 98.5 F (36.9 C)   Resp (!) 23   Ht 5\' 4"  (1.626 m)   Wt 77.1 kg   SpO2 97%   BMI 29.18 kg/m   Physical Exam  Procedures  Procedures  ED Course / MDM    Medical Decision Making Amount and/or Complexity of Data Reviewed Labs: ordered. Radiology: ordered.  Risk Prescription drug management.   Repeat troponin stable. Pain has been chronic and likely musculoskeletal in nature. Pt given orthopedic follow up and medication for pain control. Recommended for oral hydration and to return if presyncopal symptoms reoccur.   Patient in no distress and overall condition improved here in the ED. Detailed discussions were had with the patient regarding current findings, and need for close f/u with PCP or on call doctor. The patient has been instructed to return immediately if the symptoms worsen in any way for re-evaluation. Patient verbalized understanding and is in agreement with current care plan. All questions answered prior to discharge.         Edwin Dada P, DO 05/17/23 1501

## 2023-05-16 NOTE — ED Provider Notes (Signed)
[place] EMERGENCY DEPARTMENT AT [facility] Provider Note   CSN: [id number] Arrival date & time: 05/15/23  2234     History  Chief Complaint  Patient presents with   Near Syncope    Vincent Wade is a 57 y.o. male.  Level 5 caveat for language barrier.  Translator is used.  Patient here with episode of "I feel like I'm going to pass out" that came on suddenly while he was walking.  Symptoms resolved after about 10 to 15 minutes he is now feeling better.  He describes a feeling of lightheadedness that came on suddenly.  No room spinning dizziness.  No chest pain or shortness of breath.  No nausea, vomiting, diaphoresis but he did feel "hot all over".  Did not fall or pass out.  Did not hit head.  He feels better now.  No headache no visual change.  He does mention ongoing left shoulder pain since falling about a month ago.  Not significantly different today.  No weakness, numbness, tingling.  No difficulty speaking or difficulty swallowing.  No headache or visual changes.  Has chronic blurry vision of his left eye which is unchanged.  Denies headache.  Denies any difficulty speaking or difficulty swallowing.  No chest pain or shortness of breath.  Not feeling dizzy any longer.  Denies any medical history or regular medications.  The history is provided by the patient. The history is limited by a language barrier. A language interpreter was used.  Near Syncope Pertinent negatives include no chest pain and no abdominal pain.       Home Medications Prior to Admission medications   Medication Sig Start Date End Date Taking? Authorizing Provider  diclofenac Sodium (VOLTAREN ARTHRITIS PAIN) 1 % GEL Apply 2 g topically 4 (four) times daily. As needed 03/14/22   Crain, Whitney L, PA  ibuprofen (ADVIL) 400 MG tablet Take 400 mg by mouth every 6 (six) hours as needed.    [provider]  meloxicam (MOBIC) 7.5 MG tablet Take 1 tablet (7.5 mg total) by mouth daily. 03/14/22    Guy Sandifer L, PA      Allergies    Patient has no known allergies.    Review of Systems   Review of Systems  Constitutional:  Negative for activity change, appetite change and fever.  HENT:  Negative for congestion and rhinorrhea.   Respiratory:  Negative for chest tightness.   Cardiovascular:  Positive for near-syncope. Negative for chest pain.  Gastrointestinal:  Negative for abdominal pain, nausea and vomiting.  Genitourinary:  Negative for dysuria and hematuria.  Musculoskeletal:  Positive for arthralgias and myalgias.  Skin:  Negative for rash.  Neurological:  Positive for weakness and light-headedness. Negative for numbness.    all other systems are negative except as noted in the HPI and PMH.   Physical Exam Updated Vital Signs BP (!) 146/86 (BP Location: Right Arm)   Pulse (!) 59   Temp 98 F (36.7 C) (Oral)   Resp 12   Ht 5\' 4"  (1.626 m)   Wt 77.1 kg   SpO2 99%   BMI 29.18 kg/m  Physical Exam Vitals and nursing note reviewed.  Constitutional:      General: He is not in acute distress.    Appearance: He is well-developed.  HENT:     Head: Normocephalic and atraumatic.     Mouth/Throat:     Pharynx: No oropharyngeal exudate.  Eyes:     Conjunctiva/sclera: Conjunctivae normal.  Pupils: Pupils are equal, round, and reactive to light.  Neck:     Comments: No meningismus. Cardiovascular:     Rate and Rhythm: Normal rate and regular rhythm.     Heart sounds: Normal heart sounds. No murmur heard. Pulmonary:     Effort: Pulmonary effort is normal. No respiratory distress.     Breath sounds: Normal breath sounds.  Abdominal:     Palpations: Abdomen is soft.     Tenderness: There is no abdominal tenderness. There is no guarding or rebound.  Musculoskeletal:        General: No tenderness. Normal range of motion.     Cervical back: Normal range of motion and neck supple.     Comments: No bony deformity.  Pain with range of motion of left shoulder, intact  radial pulse and cardinal hand movements.  Skin:    General: Skin is warm.  Neurological:     Mental Status: He is alert and oriented to person, place, and time.     Cranial Nerves: No cranial nerve deficit.     Motor: No abnormal muscle tone.     Coordination: Coordination normal.     Comments:  5/5 strength throughout. CN 2-12 intact.Equal grip strength.   Psychiatric:        Behavior: Behavior normal.     ED Results / Procedures / Treatments   Labs (all labs ordered are listed, but only abnormal results are displayed) Labs Reviewed  BASIC METABOLIC PANEL - Abnormal; Notable for the following components:      Result Value   Glucose, Bld 134 (*)    All other components within normal limits  URINALYSIS, ROUTINE W REFLEX MICROSCOPIC - Abnormal; Notable for the following components:   Color, Urine COLORLESS (*)    All other components within normal limits  CBC  D-DIMER, QUANTITATIVE  CBG MONITORING, ED  TROPONIN I (HIGH SENSITIVITY)  TROPONIN I (HIGH SENSITIVITY)    EKG EKG Interpretation Date/Time:  Saturday May 15 2023 22:58:00 EST Ventricular Rate:  67 PR Interval:  134 QRS Duration:  74 QT Interval:  390 QTC Calculation: 412 R Axis:   14  Text Interpretation: Normal sinus rhythm Normal ECG When compared with ECG of 18-Feb-2021 23:46, No significant change was found No significant change was found Confirmed by Glynn Octave [phone]) on 05/16/2023 12:01:01 AM  Radiology DG Shoulder Left Result Date: 05/16/2023 CLINICAL DATA:  57 year old male with history of left-sided shoulder pain after a fall. EXAM: LEFT SHOULDER - 2+ VIEW COMPARISON:  No priors. FINDINGS: There is no evidence of fracture or dislocation. There is no evidence of arthropathy or other focal bone abnormality. Soft tissues are unremarkable. IMPRESSION: Negative. Electronically Signed   By: Trudie Reed M.D.   On: 05/16/2023 05:25   DG Chest 2 View Result Date: 05/16/2023 CLINICAL DATA:   57 year old male with history of shortness of breath and dizziness. EXAM: CHEST - 2 VIEW COMPARISON:  No priors. FINDINGS: Lung volumes are normal. No consolidative airspace disease. No pleural effusions. No pneumothorax. No pulmonary nodule or mass noted. Pulmonary vasculature and the cardiomediastinal silhouette are within normal limits. IMPRESSION: No radiographic evidence of acute cardiopulmonary disease. Electronically Signed   By: Trudie Reed M.D.   On: 05/16/2023 05:24   CT Head Wo Contrast Result Date: 05/16/2023 CLINICAL DATA:  57 year old male with dizziness and headache acute onset 1 hour ago. EXAM: CT HEAD WITHOUT CONTRAST TECHNIQUE: Contiguous axial images were obtained from the base of the skull through  the vertex without intravenous contrast. RADIATION DOSE REDUCTION: This exam was performed according to the departmental dose-optimization program which includes automated exposure control, adjustment of the mA and/or kV according to patient size and/or use of iterative reconstruction technique. COMPARISON:  Head CT 02/18/2021. FINDINGS: Brain: Cerebral volume remains normal. No midline shift, ventriculomegaly, mass effect, evidence of mass lesion, intracranial hemorrhage or evidence of cortically based acute infarction. Gray-white matter differentiation is within normal limits throughout the brain. Vascular: No suspicious intracranial vascular hyperdensity. Mild Calcified atherosclerosis at the skull base. Skull: Intact, negative. Sinuses/Orbits: Small new fluid level in the right maxillary sinus. Minimal right sphenoid sinus mucosal thickening. Other visible paranasal sinuses, tympanic cavities, and mastoids remain well aerated. Other: Visualized orbits and scalp soft tissues are within normal limits. IMPRESSION: 1. Stable since 2022 and normal for age normal noncontrast CT appearance of the brain. 2. Mild new paranasal sinus inflammation. Electronically Signed   By: Odessa Fleming M.D.   On:  05/16/2023 05:10    Procedures Procedures    Medications Ordered in ED Medications  ibuprofen (ADVIL) tablet 800 mg (has no administration in time range)    ED Course/ Medical Decision Making/ A&P                                 Medical Decision Making Amount and/or Complexity of Data Reviewed Labs: ordered. Decision-making details documented in ED Course. Radiology: ordered and independent interpretation performed. Decision-making details documented in ED Course. ECG/medicine tests: ordered and independent interpretation performed. Decision-making details documented in ED Course.  Risk Prescription drug management.   Near syncope with dizzy episode now resolved.  No room spinning dizziness.  No chest pain or shortness of breath.  Neurological exam is nonfocal.  Low suspicion for CVA or TIA.  EKG is sinus rhythm, no Brugada, no prolonged QT.  Labs reassuring. Troponin negative. D-dimer negative.  CXR negative. L shoulder Xray negative. Results reviewed and interpreted by me.   Patient feels back to normal.  Orthostatics are negative.  No dizziness or lightheadedness.  No chest pain or shortness of breath.  Low suspicion for ACS or PE.  Shoulder pain has been ongoing since a fall last month.  This not new today.  X-ray of shoulder is negative  Family concerned what can he take for his shoulder.  Will refer to PCP for further evaluation including possible MRI as well as give course of anti-inflammatories.  Repeat troponin pending at shift change.  Anticipate discharge home with outpatient follow-up for echocardiogram and further workup for near syncope.  Dr. Wallace Cullens to assume care.          Final Clinical Impression(s) / ED Diagnoses Final diagnoses:  None    Rx / DC Orders ED Discharge Orders     None         Braylen Denunzio, Jeannett Senior, MD 05/16/23 [phone]

## 2023-05-16 NOTE — ED Notes (Signed)
Pt ambulated with no complaints

## 2023-07-26 ENCOUNTER — Ambulatory Visit
Admission: EM | Admit: 2023-07-26 | Discharge: 2023-07-26 | Disposition: A | Discharge status: Home / Self Care | Payer: No Typology Code available for payment source | Attending: Family Medicine | Admitting: Family Medicine

## 2023-07-26 DIAGNOSIS — B349 Viral infection, unspecified: Secondary | ICD-10-CM

## 2023-07-26 LAB — POC COVID19/FLU A&B COMBO
Covid Antigen, POC: NEGATIVE
Influenza A Antigen, POC: NEGATIVE
Influenza B Antigen, POC: NEGATIVE

## 2023-07-26 MED ORDER — PROMETHAZINE-DM 6.25-15 MG/5ML PO SYRP: 5.0000 mL | ORAL_SOLUTION | Freq: Three times a day (TID) | ORAL | 0 refills | Status: AC | PRN

## 2023-07-26 MED ORDER — IBUPROFEN 600 MG PO TABS: 600.0000 mg | ORAL_TABLET | Freq: Four times a day (QID) | ORAL | 0 refills | Status: DC | PRN

## 2023-07-26 MED ORDER — CETIRIZINE HCL 10 MG PO TABS: 10.0000 mg | ORAL_TABLET | Freq: Every day | ORAL | 0 refills | Status: AC

## 2023-07-26 MED ORDER — PSEUDOEPHEDRINE HCL 60 MG PO TABS: 60.0000 mg | ORAL_TABLET | Freq: Three times a day (TID) | ORAL | 0 refills | Status: AC | PRN

## 2023-07-26 NOTE — Discharge Instructions (Signed)
 Para el dolor de garganta o tos puede usar un t de miel. Use 3 cucharaditas de miel con jugo exprimido de [employer]. Coloque trozos de Microbiologist en 1/2-1 taza de agua y caliente sobre la estufa. Luego mezcle los ingredientes y repita cada 4 horas. Para fiebre, dolores de cuerpo tome ibuprofeno 600mg  con comida cada 6 horas alternando con o junto con Tylenol 500mg -650mg  cada 6 horas. Hidrata muy bien con al menos 2 litros (64 onzas) de agua al dia. Coma comidas ligeras como sopas para [employer] y nutricion. Tambien puede tomar suero. Comience un antihistamnico como Zyrtec (cetirizina) 10mg  al dia. Puede usar pseudoefedrina (Sudafed) de venta libre para el goteo posnasal, congestin a una dosis de 60 mg cada 8 o cada 12 horas. Use el jarabe para su tos.

## 2023-07-26 NOTE — ED Provider Notes (Signed)
 [facility]  Note:  This document was prepared using Conservation officer, historic buildings and may include unintentional dictation errors.  MRN: [id number] DOB: Jan 28, 1966  Subjective:   Vincent Wade is a 58 y.o. male presenting for 3-day history of acute onset throat pain, coughing, malaise and fatigue, headaches.  Has had slight shortness of breath.  No asthma.  No smoking.  No current facility-administered medications for this encounter.  Current Outpatient Medications:    diclofenac Sodium (VOLTAREN ARTHRITIS PAIN) 1 % GEL, Apply 2 g topically 4 (four) times daily. As needed, Disp: 100 g, Rfl: 0   ibuprofen (ADVIL) 400 MG tablet, Take 400 mg by mouth every 6 (six) hours as needed., Disp: , Rfl:    meloxicam (MOBIC) 7.5 MG tablet, Take 1 tablet (7.5 mg total) by mouth daily., Disp: 30 tablet, Rfl: 0   No Known Allergies  Past Medical History:  Diagnosis Date   GERD (gastroesophageal reflux disease)    OTC meds   MMT (medial meniscus tear)    right knee     Past Surgical History:  Procedure Laterality Date   KNEE ARTHROSCOPY WITH MENISCAL REPAIR Right 07/24/2020   Procedure: RIGHT KNEE ARTHROSCOPY WITH MEDIAL MENISCUS REPAIR;  Surgeon: Tarry Kos, MD;  Location: [facility];  Service: Orthopedics;  Laterality: Right;    History reviewed. No pertinent family history.  Social History   Tobacco Use   Smoking status: Former    Types: Cigarettes   Smokeless tobacco: Never  Vaping Use   Vaping status: Never Used  Substance Use Topics   Alcohol use: Yes    Comment: "very little"   Drug use: Never    ROS   Objective:   Vitals: BP 139/79 (BP Location: Left Arm)   Pulse 64   Temp 99 F (37.2 C) (Oral)   Resp 18   SpO2 98%   Physical Exam Constitutional:      General: He is not in acute distress.    Appearance: Normal appearance. He is well-developed and normal weight. He is not ill-appearing, toxic-appearing or  diaphoretic.  HENT:     Head: Normocephalic and atraumatic.     Right Ear: Tympanic membrane, ear canal and external ear normal. No drainage, swelling or tenderness. No middle ear effusion. There is no impacted cerumen. Tympanic membrane is not erythematous or bulging.     Left Ear: Tympanic membrane, ear canal and external ear normal. No drainage, swelling or tenderness.  No middle ear effusion. There is no impacted cerumen. Tympanic membrane is not erythematous or bulging.     Nose: Nose normal. No congestion or rhinorrhea.     Mouth/Throat:     Mouth: Mucous membranes are moist.     Pharynx: Oropharynx is clear. No oropharyngeal exudate or posterior oropharyngeal erythema.  Eyes:     General: No scleral icterus.       Right eye: No discharge.        Left eye: No discharge.     Extraocular Movements: Extraocular movements intact.     Conjunctiva/sclera: Conjunctivae normal.  Cardiovascular:     Rate and Rhythm: Normal rate and regular rhythm.     Heart sounds: Normal heart sounds. No murmur heard.    No friction rub. No gallop.  Pulmonary:     Effort: Pulmonary effort is normal. No respiratory distress.     Breath sounds: Normal breath sounds. No stridor. No wheezing, rhonchi or rales.  Musculoskeletal:  Cervical back: Normal range of motion and neck supple. No rigidity. No muscular tenderness.  Neurological:     General: No focal deficit present.     Mental Status: He is alert and oriented to person, place, and time.  Psychiatric:        Mood and Affect: Mood normal.        Behavior: Behavior normal.        Thought Content: Thought content normal.        Judgment: Judgment normal.    Results for orders placed or performed during the hospital encounter of 07/26/23 (from the past 24 hours)  POC Covid19/Flu A&B Antigen     Status: Normal   Collection Time: 07/26/23  5:16 PM  Result Value Ref Range   Influenza A Antigen, POC Negative    Influenza B Antigen, POC Negative     Covid Antigen, POC Negative      Assessment and Plan :   PDMP not reviewed this encounter.  1. Acute viral syndrome    Deferred imaging given clear cardiopulmonary exam, hemodynamically stable vital signs.  Suspect viral URI, viral syndrome. Physical exam findings reassuring and vital signs stable for discharge. Advised supportive care, offered symptomatic relief. Counseled patient on potential for adverse effects with medications prescribed/recommended today, ER and return-to-clinic precautions discussed, patient verbalized understanding.     Wallis Bamberg, [place] 07/26/23 1610

## 2023-07-26 NOTE — ED Triage Notes (Signed)
 Pt reports, sore throat, headache, cough x 3 days. Ibuporfen gives relief with pain;  otc cough meds gives no relief.

## 2023-10-07 ENCOUNTER — Encounter

## 2023-10-07 ENCOUNTER — Encounter (HOSPITAL_BASED_OUTPATIENT_CLINIC_OR_DEPARTMENT_OTHER): Payer: Self-pay

## 2023-10-07 NOTE — ED Triage Notes (Addendum)
 Patient arrives with complaints of left side abdominal pain x1 month. Also reporting increased shortness of breath.  Dialysis completed today.  Tues/Thurs/Saturday Dialysis schedule. 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

## 2023-10-19 ENCOUNTER — Ambulatory Visit
Admission: EM | Admit: 2023-10-19 | Discharge: 2023-10-19 | Disposition: A | Discharge status: Home / Self Care | Attending: Family Medicine | Admitting: Family Medicine

## 2023-10-19 DIAGNOSIS — L089 Local infection of the skin and subcutaneous tissue, unspecified: Secondary | ICD-10-CM

## 2023-10-19 MED ORDER — DOXYCYCLINE HYCLATE 100 MG PO CAPS: 100.0000 mg | ORAL_CAPSULE | Freq: Two times a day (BID) | ORAL | 0 refills | Status: AC

## 2023-10-19 MED ORDER — IBUPROFEN 400 MG PO TABS: 400.0000 mg | ORAL_TABLET | Freq: Four times a day (QID) | ORAL | 0 refills | Status: AC | PRN

## 2023-10-19 NOTE — ED Provider Notes (Signed)
 [facility]  Note:  This document was prepared using Conservation officer, historic buildings and may include unintentional dictation errors.  MRN: [id number] DOB: 08-Aug-1965  Subjective:   Vincent Wade is a 58 y.o. male presenting for 2 week history of persistent and worsening from tender knot over the temporal posterior scalp.  No drainage of pus or bleeding.  No history of skin disorders.  No current facility-administered medications for this encounter.  Current Outpatient Medications:    cetirizine  (ZYRTEC  ALLERGY) 10 MG tablet, Take 1 tablet (10 mg total) by mouth daily., Disp: 30 tablet, Rfl: 0   ibuprofen  (ADVIL ) 600 MG tablet, Take 1 tablet (600 mg total) by mouth every 6 (six) hours as needed., Disp: 30 tablet, Rfl: 0   promethazine -dextromethorphan (PROMETHAZINE -DM) 6.25-15 MG/5ML syrup, Take 5 mLs by mouth 3 (three) times daily as needed for cough., Disp: 200 mL, Rfl: 0   pseudoephedrine  (SUDAFED) 60 MG tablet, Take 1 tablet (60 mg total) by mouth every 8 (eight) hours as needed for congestion., Disp: 30 tablet, Rfl: 0   No Known Allergies  Past Medical History:  Diagnosis Date   GERD (gastroesophageal reflux disease)    OTC meds   MMT (medial meniscus tear)    right knee     Past Surgical History:  Procedure Laterality Date   KNEE ARTHROSCOPY WITH MENISCAL REPAIR Right 07/24/2020   Procedure: RIGHT KNEE ARTHROSCOPY WITH MEDIAL MENISCUS REPAIR;  Surgeon: Wes Hamman, MD;  Location: [facility];  Service: Orthopedics;  Laterality: Right;    History reviewed. No pertinent family history.  Social History   Tobacco Use   Smoking status: Former    Types: Cigarettes   Smokeless tobacco: Never  Vaping Use   Vaping status: Never Used  Substance Use Topics   Alcohol use: Yes    Comment: "very little"   Drug use: Never    ROS   Objective:   Vitals: BP 128/70 (BP Location: Left Arm)   Pulse 96   Temp 98.9 F (37.2 C) (Oral)    Resp 18   SpO2 98%   Physical Exam Constitutional:      General: He is not in acute distress.    Appearance: Normal appearance. He is well-developed and normal weight. He is not ill-appearing, toxic-appearing or diaphoretic.  HENT:     Head: Normocephalic and atraumatic.      Right Ear: External ear normal.     Left Ear: External ear normal.     Nose: Nose normal.     Mouth/Throat:     Pharynx: Oropharynx is clear.  Eyes:     General: No scleral icterus.       Right eye: No discharge.        Left eye: No discharge.     Extraocular Movements: Extraocular movements intact.  Cardiovascular:     Rate and Rhythm: Normal rate.  Pulmonary:     Effort: Pulmonary effort is normal.  Musculoskeletal:     Cervical back: Normal range of motion.  Neurological:     Mental Status: He is alert and oriented to person, place, and time.  Psychiatric:        Mood and Affect: Mood normal.        Behavior: Behavior normal.        Thought Content: Thought content normal.        Judgment: Judgment normal.     Assessment and Plan :   PDMP not reviewed this  encounter.  1. Infection of scalp    Will cover for superficial infection of the scalp as there is no sign of abscess, infected cyst.  Recommended doxycycline, warm compresses and ibuprofen  for pain and inflammation.  Counseled patient on potential for adverse effects with medications prescribed/recommended today, ER and return-to-clinic precautions discussed, patient verbalized understanding.    Adolph Hoop, [place] 10/19/23 1610

## 2023-10-19 NOTE — Discharge Instructions (Addendum)
 Comience a tomar doxiciclina para tratar la infeccin del cuero cabelludo. Aplique compresas tibias 2 o 3 veces al da. Use ibuprofeno para el dolor y la inflamacin. Si la inflamacin empeora, empieza a supurar o presenta ms dolor o fiebre alta, regrese a nuestra clnica para seguimento.

## 2023-10-19 NOTE — ED Triage Notes (Addendum)
 Itchy bump on the back of his head x 2 weeks.
